# Patient Record
Sex: Female | Born: 2000 | Race: Black or African American | Hispanic: No | Marital: Single | Smoking: Never smoker
Health system: Southern US, Community
[De-identification: ages and names within clinical notes are randomized; demographics above are authoritative.]

## PROBLEM LIST (undated history)

## (undated) ENCOUNTER — Emergency Department (HOSPITAL_COMMUNITY): Admission: EM | Payer: Medicaid Other

## (undated) DIAGNOSIS — D649 Anemia, unspecified: Secondary | ICD-10-CM

## (undated) DIAGNOSIS — G43909 Migraine, unspecified, not intractable, without status migrainosus: Secondary | ICD-10-CM

## (undated) DIAGNOSIS — F329 Major depressive disorder, single episode, unspecified: Secondary | ICD-10-CM

## (undated) DIAGNOSIS — E669 Obesity, unspecified: Secondary | ICD-10-CM

## (undated) DIAGNOSIS — F32A Depression, unspecified: Secondary | ICD-10-CM

## (undated) DIAGNOSIS — F419 Anxiety disorder, unspecified: Secondary | ICD-10-CM

## (undated) HISTORY — PX: NO PAST SURGERIES: SHX2092

---
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
## 2020-01-15 ENCOUNTER — Emergency Department (HOSPITAL_BASED_OUTPATIENT_CLINIC_OR_DEPARTMENT_OTHER)
Admission: EM | Admit: 2020-01-15 | Discharge: 2020-01-15 | Disposition: A | Discharge status: Home / Self Care | Payer: Medicaid Other | Attending: Emergency Medicine | Admitting: Emergency Medicine

## 2020-01-15 ENCOUNTER — Emergency Department (HOSPITAL_BASED_OUTPATIENT_CLINIC_OR_DEPARTMENT_OTHER): Payer: Medicaid Other

## 2020-01-15 ENCOUNTER — Other Ambulatory Visit: Payer: Self-pay

## 2020-01-15 ENCOUNTER — Encounter (HOSPITAL_BASED_OUTPATIENT_CLINIC_OR_DEPARTMENT_OTHER): Payer: Self-pay | Admitting: Emergency Medicine

## 2020-01-15 DIAGNOSIS — Z20822 Contact with and (suspected) exposure to covid-19: Secondary | ICD-10-CM | POA: Diagnosis not present

## 2020-01-15 DIAGNOSIS — R062 Wheezing: Secondary | ICD-10-CM | POA: Insufficient documentation

## 2020-01-15 DIAGNOSIS — R0602 Shortness of breath: Secondary | ICD-10-CM | POA: Diagnosis not present

## 2020-01-15 DIAGNOSIS — R072 Precordial pain: Secondary | ICD-10-CM | POA: Diagnosis not present

## 2020-01-15 DIAGNOSIS — R079 Chest pain, unspecified: Secondary | ICD-10-CM

## 2020-01-15 DIAGNOSIS — R197 Diarrhea, unspecified: Secondary | ICD-10-CM | POA: Diagnosis not present

## 2020-01-15 LAB — CBC
HCT: 37.1 % (ref 36.0–46.0)
Hemoglobin: 12.6 g/dL (ref 12.0–15.0)
MCH: 30.2 pg (ref 26.0–34.0)
MCHC: 34 g/dL (ref 30.0–36.0)
MCV: 89 fL (ref 80.0–100.0)
Platelets: 302 10*3/uL (ref 150–400)
RBC: 4.17 MIL/uL (ref 3.87–5.11)
RDW: 12.5 % (ref 11.5–15.5)
WBC: 4.1 10*3/uL (ref 4.0–10.5)
nRBC: 0 % (ref 0.0–0.2)

## 2020-01-15 LAB — TROPONIN I (HIGH SENSITIVITY)
Troponin I (High Sensitivity): <2 ng/L (ref <18)
Troponin I (High Sensitivity): <2 ng/L (ref <18)

## 2020-01-15 LAB — BASIC METABOLIC PANEL
Anion gap: 9 (ref 5–15)
BUN: 10 mg/dL (ref 6–20)
CO2: 23 mmol/L (ref 22–32)
Calcium: 9.4 mg/dL (ref 8.9–10.3)
Chloride: 106 mmol/L (ref 98–111)
Creatinine, Ser: 0.65 mg/dL (ref 0.44–1.00)
GFR, Estimated: >60 mL/min (ref >60)
Glucose, Bld: 112 mg/dL — ABNORMAL HIGH (ref 70–99)
Potassium: 4.2 mmol/L (ref 3.5–5.1)
Sodium: 138 mmol/L (ref 135–145)

## 2020-01-15 LAB — D-DIMER, QUANTITATIVE: D-Dimer, Quant: 0.53 ug/mL-FEU — ABNORMAL HIGH (ref 0.00–0.50)

## 2020-01-15 LAB — PREGNANCY, URINE: Preg Test, Ur: NEGATIVE

## 2020-01-15 MED ORDER — ALBUTEROL SULFATE HFA 108 (90 BASE) MCG/ACT IN AERS
2.0000 | INHALATION_SPRAY | RESPIRATORY_TRACT | Status: DC | PRN
  Administered 2020-01-15: 2 via RESPIRATORY_TRACT

## 2020-01-15 MED ORDER — ALBUTEROL SULFATE HFA 108 (90 BASE) MCG/ACT IN AERS
INHALATION_SPRAY | RESPIRATORY_TRACT | Status: AC
  Administered 2020-01-15: 2 via RESPIRATORY_TRACT
  Filled 2020-01-15: qty 6.7

## 2020-01-15 MED ORDER — IOHEXOL 350 MG/ML SOLN
100.0000 mL | Freq: Once | INTRAVENOUS | Status: AC | PRN
  Administered 2020-01-15: 100 mL via INTRAVENOUS

## 2020-01-15 NOTE — ED Triage Notes (Signed)
Pt in from home via EMS with c/o left sided chest pain with movement. Denies N/V. Reports similar symptoms occur with her menstrual cycle and low iron. Pt reports unintentional weight loss in last month. Family history of Pulmonary embolism. 10 of albuterol 0.5 atrovent 125 mg solumedrol by EMS. 20 g IV left hand placed by EMS 150/100, 64 heart rate, 100% with treatment. 324 mg of ASA given PTA. 250 ml of NSS given. CBG 106.  Pt presents with audible wheeze.

## 2020-01-15 NOTE — Discharge Instructions (Addendum)
Your workup was reassuring today. There were no signs of blood clots in your lungs. We have sent out a COVID test - please await your results. If positive you will need to self isolate for 7 days from symptom onset.   Please follow up with your PCP and cardiologist Dr. Bjorn Pippin regarding your ED visit today  Return to the ED for any worsening symptoms

## 2020-01-15 NOTE — ED Notes (Signed)
ED Provider at bedside. 

## 2020-01-15 NOTE — ED Provider Notes (Signed)
Assumed care from [facility], PA-C at shift change pending CTA results. See her note for full HPI.  In short, patient is a 20 year old female with a medical history significant for anxiety, depression, anemia presents to the ED due to substernal chest pain that radiates into the left breast that started this morning around 10 AM.  Chest pain associated with wheeze.  Chest pain worse with deep inspiration.  Patient's mother reports a family history of DVT/PE however unsure of the cause.   Plan from previous provider: follow-up with CTA. If negative, COVID test.  ED Course/Procedures    Results for orders placed or performed during the hospital encounter of 01/15/20 (from the past 24 hour(s))  Basic metabolic panel     Status: Abnormal   Collection Time: 01/15/20 12:45 PM  Result Value Ref Range   Sodium 138 135 - 145 mmol/L   Potassium 4.2 3.5 - 5.1 mmol/L   Chloride 106 98 - 111 mmol/L   CO2 23 22 - 32 mmol/L   Glucose, Bld 112 (H) 70 - 99 mg/dL   BUN 10 6 - 20 mg/dL   Creatinine, Ser 9.32 0.44 - 1.00 mg/dL   Calcium 9.4 8.9 - 35.5 mg/dL   GFR, Estimated >73 >22 mL/min   Anion gap 9 5 - 15  CBC     Status: None   Collection Time: 01/15/20 12:45 PM  Result Value Ref Range   WBC 4.1 4.0 - 10.5 K/uL   RBC 4.17 3.87 - 5.11 MIL/uL   Hemoglobin 12.6 12.0 - 15.0 g/dL   HCT 02.5 42.7 - 06.2 %   MCV 89.0 80.0 - 100.0 fL   MCH 30.2 26.0 - 34.0 pg   MCHC 34.0 30.0 - 36.0 g/dL   RDW 37.6 28.3 - 15.1 %   Platelets 302 150 - 400 K/uL   nRBC 0.0 0.0 - 0.2 %  Troponin I (High Sensitivity)     Status: None   Collection Time: 01/15/20 12:45 PM  Result Value Ref Range   Troponin I (High Sensitivity) <2 <18 ng/L  Troponin I (High Sensitivity)     Status: None   Collection Time: 01/15/20  5:51 PM  Result Value Ref Range   Troponin I (High Sensitivity) <2 <18 ng/L  D-dimer, quantitative (not at [facility])     Status: Abnormal   Collection Time: 01/15/20  5:51 PM  Result Value Ref Range    D-Dimer, Quant 0.53 (H) 0.00 - 0.50 ug/mL-FEU  Pregnancy, urine     Status: None   Collection Time: 01/15/20  8:29 PM  Result Value Ref Range   Preg Test, Ur NEGATIVE NEGATIVE    Procedures  MDM  Care assumed from [facility], PA-C at shift change pending CTA results. See her note for full MDM.  20 year old female presents to the ED due to substernal/left-sided pleuritic chest pain that started this morning.  Unfortunately, patient waited numerous hours before initial evaluation.  While in the waiting room mother relate concerned about a possible PE given patient's family history.  Upon arrival, stable vitals.  Patient is afebrile, not tachycardic or hypoxic.  Patient in no acute distress and non-ill-appearing.  I have personally reviewed all lab work ordered by previous provider.  CBC unremarkable no leukocytosis and normal hemoglobin.  BMP reassuring with normal renal function no major electrolyte derangements.  Mild hyperglycemia at 112.  No anion gap.  Doubt DKA.  Negative pregnancy test.  Flat troponins.  Low suspicion for ACS.  D-dimer  elevated at 0.53.  CT a personally reviewed which is negative for signs of PE or other acute abnormalities.  Chest x-ray personally reviewed which is negative for signs of pneumonia, pneumothorax, or widened mediastinum.  EKG personally reviewed which demonstrates normal sinus rhythm no signs of acute ischemia.  Patient given albuterol during her ED stay and notes resolution in symptoms.  9:27 PM reassessed patient at bedside who notes symptom improvement.  Lungs clear to auscultation bilaterally with no wheeze.  Patient speaking in full sentences.  Patient states she is feeling better and is ready to go home.  Covid test pending.  Quarantine guidelines discussed with patient Strict ED precautions discussed with patient. Patient states understanding and agrees to plan. Patient discharged home in no acute distress and stable vitals        Jesusita Oka 01/15/20 [id number]    Charlynne Pander, MD 01/15/20 [phone]

## 2020-01-15 NOTE — ED Notes (Signed)
Pt ambulated through hall. SpO2 maintained at 97 or above. HR no higher than 110. Assisted back into bed. Reports that her chest pain has subsided but still feels wheezy. Rebecca [name], [name] informed. RT Brett Canales requested at bedside to evaluate patient.

## 2020-01-15 NOTE — ED Provider Notes (Signed)
[facility] EMERGENCY DEPARTMENT Provider Note   CSN: [id number] Arrival date & time: 01/15/20  1217     History Chief Complaint  Patient presents with  . Chest Pain    Rebecca [name] is a 20 y.o. female with PMhx anxiety, depression, and anemia who presents to the ED today via EMS with complaint of substernal chest pain radiating into left breast that began when pt woke up around 10 AM this morning. Pt reports she then started noticing that she was wheezing which she has never done before. She called her mom because she knew "something wasn't right" and then proceeded to call EMS. Pt received 10 albuterol, 0.5 atrovent, 125 solumedrol, and 324 mg aspirin en route with some improvement in her symptoms. She states that after the medication her chest pain was no longer constant however is now present with deep inspiration. Mother reports fhx of DVTs and PE - unsure of the cause however states multiple family members have had them and a cousin recently passed away from PE. Pt vapes daily however does not smoke cigarettes. Not on OCPs. No recent prolonged travel or immobilization. No hemoptysis. No active malignancy. No personal history of DVT/PE.   Pt reports that she sometimes will get chest pains when her hgb is low. She receives iron infusions - last received in May. She states she is currently on her  Menstrual period. She does mention that she typically has body aches and diarrhea during her period so cannot say if these symptoms are related to the chest pain and wheezing. She denies any recent sick contacts however is unvaccinated and would like to be tested today.   The history is provided by the patient and medical records.       Past Medical History:  Diagnosis Date  . Anemia   . Anxiety   . Depression   . Migraine   . Obesity     There are no problems to display for this patient.   History reviewed. No pertinent surgical history.   OB History   No obstetric history on  file.     Family History  Problem Relation Age of Onset  . Anemia Mother   . Depression Mother   . Insomnia Mother   . Asthma Father   . ADD / ADHD Father   . Bipolar disorder Father   . Bipolar disorder Maternal Grandmother   . Asthma Maternal Grandmother   . Diabetes Paternal Grandmother     Social History   Tobacco Use  . Smoking status: Never Smoker  . Smokeless tobacco: Never Used  Vaping Use  . Vaping Use: Every day  Substance Use Topics  . Alcohol use: No  . Drug use: Yes    Types: Marijuana    Home Medications Prior to Admission medications   Medication Sig Start Date End Date Taking? Authorizing Provider  fluticasone (FLONASE) 50 MCG/ACT nasal spray Place 1 spray into both nostrils as needed for allergies. 08/16/18   [provider]  hydrOXYzine (ATARAX/VISTARIL) 25 MG tablet Take 1 tablet (25 mg total) by mouth every 6 (six) hours. 08/19/18   Henderly, Britni A, PA-C  naproxen (NAPROSYN) 500 MG tablet Take 500 mg by mouth every 12 (twelve) hours as needed for cramping. 08/16/18   [provider]  ondansetron (ZOFRAN ODT) 4 MG disintegrating tablet Take 1 tablet (4 mg total) by mouth every 6 (six) hours as needed for nausea or vomiting. 05/31/16   Lowanda Foster, NP  Allergies    Patient has no known allergies.  Review of Systems   Review of Systems  Constitutional: Negative for chills and fever.  Respiratory: Positive for shortness of breath and wheezing. Negative for cough.   Cardiovascular: Positive for chest pain.  Gastrointestinal: Positive for diarrhea.  Musculoskeletal: Positive for myalgias.  All other systems reviewed and are negative.   Physical Exam Updated Vital Signs BP 124/76 (BP Location: Left Arm)   Pulse 68   Temp 98.4 F (36.9 C) (Oral)   Resp 20   Ht 5\' 7"  (1.702 m)   Wt 99.8 kg   LMP 01/14/2020   SpO2 100%   BMI 34.46 kg/m   Physical Exam Vitals and nursing note reviewed.  Constitutional:      Appearance:  She is not ill-appearing.     Comments: Appears anxious  HENT:     Head: Normocephalic and atraumatic.  Eyes:     Conjunctiva/sclera: Conjunctivae normal.  Cardiovascular:     Rate and Rhythm: Normal rate and regular rhythm.     Pulses:          Radial pulses are 2+ on the right side and 2+ on the left side.       Dorsalis pedis pulses are 2+ on the right side and 2+ on the left side.     Heart sounds: Normal heart sounds.  Pulmonary:     Breath sounds: Normal breath sounds. No decreased breath sounds, wheezing, rhonchi or rales.     Comments: Pt able to speak in full sentences however every 5-6 words she will take a deep breath with audible upper airway wheezes. Lungs are clear to auscultation without any lower airway wheeze, rales, rhonchi. Satting 100% on RA.  Abdominal:     Palpations: Abdomen is soft.     Tenderness: There is no abdominal tenderness. There is no guarding or rebound.  Musculoskeletal:     Cervical back: Neck supple.     Right lower leg: No tenderness. No edema.     Left lower leg: No tenderness. No edema.  Skin:    General: Skin is warm and dry.  Neurological:     Mental Status: She is alert.     ED Results / Procedures / Treatments   Labs (all labs ordered are listed, but only abnormal results are displayed) Labs Reviewed  BASIC METABOLIC PANEL - Abnormal; Notable for the following components:      Result Value   Glucose, Bld 112 (*)    All other components within normal limits  D-DIMER, QUANTITATIVE (NOT AT [facility] - Abnormal; Notable for the following components:   D-Dimer, Quant 0.53 (*)    All other components within normal limits  CBC  PREGNANCY, URINE  TROPONIN I (HIGH SENSITIVITY)  TROPONIN I (HIGH SENSITIVITY)    EKG None  Radiology DG Chest 2 View  Result Date: 01/15/2020 CLINICAL DATA:  Chest pain EXAM: CHEST - 2 VIEW COMPARISON:  None. FINDINGS: Heart and mediastinal contours are within normal limits. No focal opacities or effusions.  No acute bony abnormality. IMPRESSION: No active cardiopulmonary disease. Electronically Signed   By: 03/14/2020 M.D.   On: 01/15/2020 13:10    Procedures Procedures (including critical care time)  Medications Ordered in ED Medications  albuterol (VENTOLIN HFA) 108 (90 Base) MCG/ACT inhaler 2 puff (2 puffs Inhalation Given 01/15/20 1748)  iohexol (OMNIPAQUE) 350 MG/ML injection 100 mL (100 mLs Intravenous Contrast Given 01/15/20 1932)    ED Course  I  have reviewed the triage vital signs and the nursing notes.  Pertinent labs & imaging results that were available during my care of the patient were reviewed by me and considered in my medical decision making (see chart for details).    MDM Rules/Calculators/A&P                          20 year old female who presents to the ED today with complaint of substernal/left-sided pleuritic chest pain that began this morning with associated wheezing.  She called 911 who provided her with multiple medications including albuterol, Atrovent, Solu-Medrol, 324 mg aspirin.  Patient sat in the waiting room for several hours where she had an EKG, chest x-ray, lab work occluding a troponin.  EKG sinus rhythm with sinus arrhythmia.  Chest x-ray clear.  Troponin less than 2.  CBC also obtained with a hemoglobin stable at 12.6, patient reports history of anemia and states that she will have chest pain from time to time when she is anemic.  She receives iron infusions, last received in May of this year.  BMP without electrolyte abnormalities.  While patient was in the waiting room mother relayed to nursing staff that there is a positive family history of DVT and PE, unprovoked.  It was decided that a D-dimer would be ordered while she is in the waiting room which has returned positive at 0.53.  Patient without any risk factors for PE besides this family history with unknown etiology.  We will need to obtain CTA at this time.  On exam patient appears anxious.  She is able  speak in full sentences without difficulty however every 5-6 word she will take a deep breath and have upper airway wheezing, no stridor.  She has no lower airway wheezing, rales, rhonchi.  Suspect some anxiety is causing this.  Patient with a history of asthma, has received multiple rounds of albuterol while in the waiting room.  She does endorse that she is having some diarrhea and body aches as well however states that this is typical when she was on her menses which she currently is.  She is unvaccinated against COVID-19, will plan to swab.  If CTA negative for PE and patient does not require admission we will do send out Covid test.  If CTA is positive then will obtain 2-hour Covid test.  If lab work and remainder of work-up is reassuring patient to be discharged home with close PCP follow-up.  It does appear urine pregnancy test was ordered while in the waiting room which was never obtained, she did receive a chest x-ray prior to this being obtained.  We will plan to obtain this prior to CTA.   Pt informed she needs to urinate for preg test. She is on her menstrual cycle and states she is not pregnant. States she cannot urinate at this time; radiology aware and states they do not need it for CT  At shift change case signed out to Charmaine Downs, [place], who will dispo patient accordingly. If CTA negative, she will need 6-24 COVID test. If CTA positive, she will need 2 hour COVID test.   This note was prepared using Dragon voice recognition software and may include unintentional dictation errors due to the inherent limitations of voice recognition software.  Rebecca [name] was evaluated in Emergency Department on 01/15/2020 for the symptoms described in the history of present illness. She was evaluated in the context of the global COVID-19 pandemic, which necessitated  consideration that the patient might be at risk for infection with the SARS-CoV-2 virus that causes COVID-19. Institutional protocols and  algorithms that pertain to the evaluation of patients at risk for COVID-19 are in a state of rapid change based on information released by regulatory bodies including the CDC and federal and state organizations. These policies and algorithms were followed during the patient's care in the ED.  Final Clinical Impression(s) / ED Diagnoses Final diagnoses:  Nonspecific chest pain  Person under investigation for COVID-19    Rx / DC Orders ED Discharge Orders    None       Tanda Rockers, PA-C 01/15/20 Virl Son, MD 01/15/20 2312

## 2020-01-15 NOTE — ED Notes (Signed)
Unable to provide urine. Ambulated to BR. Mom at bedside, eating kids meal from [employer]

## 2020-01-15 NOTE — ED Notes (Signed)
Called to check patient while in lobby, states her chest hurts. Pt tearful, hyperventilating

## 2020-01-15 NOTE — ED Notes (Signed)
Pt mother upset about wait time. I explained the process to her and informed her of all the care that we have provided thus far. Additional orders obtained from Dr. Silverio Lay.

## 2020-01-15 NOTE — ED Notes (Addendum)
Pt ambulatory to bathroom

## 2020-01-15 NOTE — ED Notes (Signed)
Discharge instructions discussed with patient. Verbalized understanding. All questions answered. Departs ED at this time in stable condition.

## 2020-01-16 LAB — SARS CORONAVIRUS 2 (TAT 6-24 HRS): SARS Coronavirus 2: NEGATIVE

## 2020-04-07 ENCOUNTER — Emergency Department (HOSPITAL_BASED_OUTPATIENT_CLINIC_OR_DEPARTMENT_OTHER)
Admission: EM | Admit: 2020-04-07 | Discharge: 2020-04-07 | Disposition: A | Discharge status: Home / Self Care | Payer: Medicaid Other | Attending: Emergency Medicine | Admitting: Emergency Medicine

## 2020-04-07 ENCOUNTER — Other Ambulatory Visit: Payer: Self-pay

## 2020-04-07 ENCOUNTER — Encounter (HOSPITAL_BASED_OUTPATIENT_CLINIC_OR_DEPARTMENT_OTHER): Payer: Self-pay | Admitting: Emergency Medicine

## 2020-04-07 ENCOUNTER — Emergency Department (HOSPITAL_BASED_OUTPATIENT_CLINIC_OR_DEPARTMENT_OTHER): Payer: Medicaid Other

## 2020-04-07 DIAGNOSIS — R0789 Other chest pain: Secondary | ICD-10-CM | POA: Diagnosis not present

## 2020-04-07 DIAGNOSIS — R079 Chest pain, unspecified: Secondary | ICD-10-CM | POA: Diagnosis present

## 2020-04-07 DIAGNOSIS — M791 Myalgia, unspecified site: Secondary | ICD-10-CM | POA: Insufficient documentation

## 2020-04-07 DIAGNOSIS — R52 Pain, unspecified: Secondary | ICD-10-CM

## 2020-04-07 LAB — BASIC METABOLIC PANEL
Anion gap: 7 (ref 5–15)
BUN: 8 mg/dL (ref 6–20)
CO2: 25 mmol/L (ref 22–32)
Calcium: 8.6 mg/dL — ABNORMAL LOW (ref 8.9–10.3)
Chloride: 105 mmol/L (ref 98–111)
Creatinine, Ser: 0.56 mg/dL (ref 0.44–1.00)
GFR, Estimated: >60 mL/min (ref >60)
Glucose, Bld: 97 mg/dL (ref 70–99)
Potassium: 3.8 mmol/L (ref 3.5–5.1)
Sodium: 137 mmol/L (ref 135–145)

## 2020-04-07 LAB — CBC WITH DIFFERENTIAL/PLATELET
Abs Immature Granulocytes: 0.02 10*3/uL (ref 0.00–0.07)
Basophils Absolute: 0 10*3/uL (ref 0.0–0.1)
Basophils Relative: 1 %
Eosinophils Absolute: 0 10*3/uL (ref 0.0–0.5)
Eosinophils Relative: 1 %
HCT: 32.5 % — ABNORMAL LOW (ref 36.0–46.0)
Hemoglobin: 10.8 g/dL — ABNORMAL LOW (ref 12.0–15.0)
Immature Granulocytes: 0 %
Lymphocytes Relative: 50 %
Lymphs Abs: 2.3 10*3/uL (ref 0.7–4.0)
MCH: 30 pg (ref 26.0–34.0)
MCHC: 33.2 g/dL (ref 30.0–36.0)
MCV: 90.3 fL (ref 80.0–100.0)
Monocytes Absolute: 0.3 10*3/uL (ref 0.1–1.0)
Monocytes Relative: 7 %
Neutro Abs: 1.9 10*3/uL (ref 1.7–7.7)
Neutrophils Relative %: 41 %
Platelets: 294 10*3/uL (ref 150–400)
RBC: 3.6 MIL/uL — ABNORMAL LOW (ref 3.87–5.11)
RDW: 12.7 % (ref 11.5–15.5)
Smear Review: NORMAL
WBC: 4.6 10*3/uL (ref 4.0–10.5)
nRBC: 0 % (ref 0.0–0.2)

## 2020-04-07 LAB — HEPATIC FUNCTION PANEL
ALT: 11 U/L (ref 0–44)
AST: 17 U/L (ref 15–41)
Albumin: 3.5 g/dL (ref 3.5–5.0)
Alkaline Phosphatase: 55 U/L (ref 38–126)
Bilirubin, Direct: 0.1 mg/dL (ref 0.0–0.2)
Indirect Bilirubin: 0.3 mg/dL (ref 0.3–0.9)
Total Bilirubin: 0.4 mg/dL (ref 0.3–1.2)
Total Protein: 7.2 g/dL (ref 6.5–8.1)

## 2020-04-07 LAB — URINALYSIS, MICROSCOPIC (REFLEX): WBC, UA: NONE SEEN WBC/hpf (ref 0–5)

## 2020-04-07 LAB — URINALYSIS, ROUTINE W REFLEX MICROSCOPIC
Bilirubin Urine: NEGATIVE
Glucose, UA: NEGATIVE mg/dL
Ketones, ur: NEGATIVE mg/dL
Leukocytes,Ua: NEGATIVE
Nitrite: NEGATIVE
Protein, ur: NEGATIVE mg/dL
Specific Gravity, Urine: 1.015 (ref 1.005–1.030)
pH: 7 (ref 5.0–8.0)

## 2020-04-07 LAB — PREGNANCY, URINE: Preg Test, Ur: NEGATIVE

## 2020-04-07 LAB — TROPONIN I (HIGH SENSITIVITY): Troponin I (High Sensitivity): <2 ng/L (ref <18)

## 2020-04-07 LAB — LIPASE, BLOOD: Lipase: 26 U/L (ref 11–51)

## 2020-04-07 MED ORDER — IBUPROFEN 100 MG/5ML PO SUSP
400.0000 mg | Freq: Once | ORAL | Status: AC
  Administered 2020-04-07: 400 mg via ORAL
  Filled 2020-04-07: qty 20

## 2020-04-07 MED ORDER — HYDROCODONE-ACETAMINOPHEN 7.5-325 MG/15ML PO SOLN: 10.0000 mL | Freq: Once | ORAL | Status: DC

## 2020-04-07 NOTE — ED Provider Notes (Signed)
[facility] EMERGENCY DEPARTMENT Provider Note   CSN: [id number] Arrival date & time: 04/07/20  1740     History Chief Complaint  Patient presents with  . Generalized Body Aches    Rebecca [name] is a 20 y.o. female.  Presented to ER with multiple complaints.  Patient reports yesterday she was nauseated and had a few episodes of nonbloody nonbilious emesis.  No more vomiting today, does not feel nauseous at present.  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  States pain worse in her chest.  Seems to have eased off.  No longer having pain.  HPI     Past Medical History:  Diagnosis Date  . Anemia   . Anxiety   . Depression   . Migraine   . Obesity     There are no problems to display for this patient.   History reviewed. No pertinent surgical history.   OB History   No obstetric history on file.     Family History  Problem Relation Age of Onset  . Anemia Mother   . Depression Mother   . Insomnia Mother   . Asthma Father   . ADD / ADHD Father   . Bipolar disorder Father   . Bipolar disorder Maternal Grandmother   . Asthma Maternal Grandmother   . Diabetes Paternal Grandmother     Social History   Tobacco Use  . Smoking status: Never Smoker  . Smokeless tobacco: Never Used  Vaping Use  . Vaping Use: Every day  Substance Use Topics  . Alcohol use: No  . Drug use: Yes    Types: Marijuana    Home Medications Prior to Admission medications   Medication Sig Start Date End Date Taking? Authorizing Provider  fluticasone (FLONASE) 50 MCG/ACT nasal spray Place 1 spray into both nostrils as needed for allergies. 08/16/18   [provider]  hydrOXYzine (ATARAX/VISTARIL) 25 MG tablet Take 1 tablet (25 mg total) by mouth every 6 (six) hours. 08/19/18   Henderly, Britni A, PA-C  naproxen (NAPROSYN) 500 MG tablet Take 500 mg by mouth every 12 (twelve) hours as needed for cramping.  08/16/18   [provider]  ondansetron (ZOFRAN ODT) 4 MG disintegrating tablet Take 1 tablet (4 mg total) by mouth every 6 (six) hours as needed for nausea or vomiting. 05/31/16   Lowanda Foster, NP    Allergies    Patient has no known allergies.  Review of Systems   Review of Systems  Constitutional: Positive for fatigue. Negative for chills and fever.  HENT: Negative for ear pain and sore throat.   Eyes: Negative for pain and visual disturbance.  Respiratory: Negative for cough and shortness of breath.   Cardiovascular: Positive for chest pain. Negative for palpitations.  Gastrointestinal: Positive for abdominal pain. Negative for vomiting.  Genitourinary: Negative for dysuria and hematuria.  Musculoskeletal: Negative for arthralgias and back pain.  Skin: Negative for color change and rash.  Neurological: Negative for seizures and syncope.  All other systems reviewed and are negative.   Physical Exam Updated Vital Signs BP 115/78 (BP Location: Right Arm)   Pulse 68   Temp 98.3 F (36.8 C) (Oral)   Resp 18   Ht 5\' 7"  (1.702 m)   Wt 99.8 kg   LMP 03/21/2020   SpO2 100%   BMI 34.46 kg/m   Physical Exam Vitals and nursing note reviewed.  Constitutional:  General: She is not in acute distress.    Appearance: She is well-developed.  HENT:     Head: Normocephalic and atraumatic.  Eyes:     Conjunctiva/sclera: Conjunctivae normal.  Cardiovascular:     Rate and Rhythm: Normal rate and regular rhythm.     Heart sounds: No murmur heard.   Pulmonary:     Effort: Pulmonary effort is normal. No respiratory distress.     Breath sounds: Normal breath sounds.  Abdominal:     Palpations: Abdomen is soft.     Tenderness: There is no abdominal tenderness.  Musculoskeletal:        General: No deformity or signs of injury.     Cervical back: Neck supple.  Skin:    General: Skin is warm and dry.  Neurological:     Mental Status: She is alert.  Psychiatric:      Comments: Occasional inappropriate laughter     ED Results / Procedures / Treatments   Labs (all labs ordered are listed, but only abnormal results are displayed) Labs Reviewed  URINALYSIS, ROUTINE W REFLEX MICROSCOPIC - Abnormal; Notable for the following components:      Result Value   APPearance CLOUDY (*)    Hgb urine dipstick LARGE (*)    All other components within normal limits  BASIC METABOLIC PANEL - Abnormal; Notable for the following components:   Calcium 8.6 (*)    All other components within normal limits  CBC WITH DIFFERENTIAL/PLATELET - Abnormal; Notable for the following components:   RBC 3.60 (*)    Hemoglobin 10.8 (*)    HCT 32.5 (*)    All other components within normal limits  URINALYSIS, MICROSCOPIC (REFLEX) - Abnormal; Notable for the following components:   Bacteria, UA RARE (*)    All other components within normal limits  PREGNANCY, URINE  HEPATIC FUNCTION PANEL  LIPASE, BLOOD  TROPONIN I (HIGH SENSITIVITY)    EKG EKG Interpretation  Date/Time:  Saturday April 07 2020 18:00:54 EDT Ventricular Rate:  56 PR Interval:    QRS Duration: 96 QT Interval:  396 QTC Calculation: 383 R Axis:   85 Text Interpretation: Sinus rhythm Atrial premature complex Confirmed by Marianna Fuss ([id number]) on 04/07/2020 6:29:17 PM   Radiology DG Chest 2 View  Result Date: 04/07/2020 CLINICAL DATA:  Chest pain EXAM: CHEST - 2 VIEW COMPARISON:  01/15/2020 FINDINGS: The heart size and mediastinal contours are within normal limits. Both lungs are clear. The visualized skeletal structures are unremarkable. IMPRESSION: No active cardiopulmonary disease. Electronically Signed   By: Deatra Robinson M.D.   On: 04/07/2020 18:51    Procedures Procedures   Medications Ordered in ED Medications  ibuprofen (ADVIL) 100 MG/5ML suspension 400 mg (400 mg Oral Given 04/07/20 1829)    ED Course  I have reviewed the triage vital signs and the nursing notes.  Pertinent labs & imaging  results that were available during my care of the patient were reviewed by me and considered in my medical decision making (see chart for details).    MDM Rules/Calculators/A&P                         20 year old presents to ER after she developed sudden generalized body pains, chest pains while at work.  On my exam, she appears well in no distress.  Her vital signs are all stable.  She had some tenderness over her anterior chest wall but otherwise unremarkable.  EKG without acute  ischemic change, troponin of the normal limits, doubt ACS.  The remainder of her labs are all grossly stable.  Recommend she follow-up with her primary doctor.  Discharge.   After the discussed management above, the patient was determined to be safe for discharge.  The patient was in agreement with this plan and all questions regarding their care were answered.  ED return precautions were discussed and the patient will return to the ED with any significant worsening of condition.   Final Clinical Impression(s) / ED Diagnoses Final diagnoses:  Chest pain, unspecified type  Body aches    Rx / DC Orders ED Discharge Orders    None       Milagros Loll, MD 04/07/20 [phone]

## 2020-04-07 NOTE — ED Triage Notes (Addendum)
Brought by ems from a restaurant.  Reports all over body pain.  Hx of the same.  Told ems she had a hx of anemia that requires iron infusions.  Ems also reported she was anxious having a "panick attack" on their arrival.  Tearful in triage.  Later in triage c/o chest pain as well.  Reports she had n/v all day yesterday then felt weird when she got to work.

## 2020-04-07 NOTE — Discharge Instructions (Addendum)
Please follow-up with your primary doctor.  Return to ER if you develop worsening chest pain or other new concerning symptom.

## 2020-05-02 ENCOUNTER — Other Ambulatory Visit: Payer: Self-pay

## 2020-05-02 ENCOUNTER — Emergency Department (HOSPITAL_BASED_OUTPATIENT_CLINIC_OR_DEPARTMENT_OTHER): Payer: Medicaid Other

## 2020-05-02 ENCOUNTER — Emergency Department (HOSPITAL_BASED_OUTPATIENT_CLINIC_OR_DEPARTMENT_OTHER)
Admission: EM | Admit: 2020-05-02 | Discharge: 2020-05-02 | Disposition: A | Discharge status: Home / Self Care | Payer: Medicaid Other | Attending: Emergency Medicine | Admitting: Emergency Medicine

## 2020-05-02 ENCOUNTER — Encounter (HOSPITAL_BASED_OUTPATIENT_CLINIC_OR_DEPARTMENT_OTHER): Payer: Self-pay

## 2020-05-02 DIAGNOSIS — S8991XA Unspecified injury of right lower leg, initial encounter: Secondary | ICD-10-CM | POA: Diagnosis present

## 2020-05-02 DIAGNOSIS — S8391XA Sprain of unspecified site of right knee, initial encounter: Secondary | ICD-10-CM | POA: Diagnosis not present

## 2020-05-02 DIAGNOSIS — Y9302 Activity, running: Secondary | ICD-10-CM | POA: Insufficient documentation

## 2020-05-02 DIAGNOSIS — W108XXA Fall (on) (from) other stairs and steps, initial encounter: Secondary | ICD-10-CM | POA: Insufficient documentation

## 2020-05-02 DIAGNOSIS — S86911A Strain of unspecified muscle(s) and tendon(s) at lower leg level, right leg, initial encounter: Secondary | ICD-10-CM

## 2020-05-02 DIAGNOSIS — S8392XA Sprain of unspecified site of left knee, initial encounter: Secondary | ICD-10-CM | POA: Insufficient documentation

## 2020-05-02 DIAGNOSIS — S86912A Strain of unspecified muscle(s) and tendon(s) at lower leg level, left leg, initial encounter: Secondary | ICD-10-CM

## 2020-05-02 NOTE — ED Triage Notes (Signed)
Pt sates she was running down steps yesterday/fell-pain to bilat knees with right worse that left-to triage in w/c-NAD

## 2020-05-02 NOTE — Discharge Instructions (Addendum)
Follow up with your primary doctor. Return to ER as needed. Take tylenol or motrin as needed for pain.

## 2020-05-02 NOTE — ED Provider Notes (Signed)
[facility] EMERGENCY DEPARTMENT Provider Note   CSN: [id number] Arrival date & time: 05/02/20  1143     History Chief Complaint  Patient presents with  . Fall    Rebecca [name] is a 20 y.o. female.  Presents to ER with concern for fall.  Patient reports that she fell from standing landing on both of her knees.  Is having pain primarily in her right knee.  Is able to walk.  No numbness or weakness.  Denies any other injuries.  HPI     Past Medical History:  Diagnosis Date  . Anemia   . Anxiety   . Depression   . Migraine   . Obesity     There are no problems to display for this patient.   History reviewed. No pertinent surgical history.   OB History   No obstetric history on file.     Family History  Problem Relation Age of Onset  . Anemia Mother   . Depression Mother   . Insomnia Mother   . Asthma Father   . ADD / ADHD Father   . Bipolar disorder Father   . Bipolar disorder Maternal Grandmother   . Asthma Maternal Grandmother   . Diabetes Paternal Grandmother     Social History   Tobacco Use  . Smoking status: Never Smoker  . Smokeless tobacco: Never Used  Vaping Use  . Vaping Use: Former  Substance Use Topics  . Alcohol use: No  . Drug use: Yes    Types: Marijuana    Home Medications Prior to Admission medications   Medication Sig Start Date End Date Taking? Authorizing Provider  fluticasone (FLONASE) 50 MCG/ACT nasal spray Place 1 spray into both nostrils as needed for allergies. 08/16/18   [provider]  hydrOXYzine (ATARAX/VISTARIL) 25 MG tablet Take 1 tablet (25 mg total) by mouth every 6 (six) hours. 08/19/18   Henderly, Britni A, PA-C  naproxen (NAPROSYN) 500 MG tablet Take 500 mg by mouth every 12 (twelve) hours as needed for cramping. 08/16/18   [provider]  ondansetron (ZOFRAN ODT) 4 MG disintegrating tablet Take 1 tablet (4 mg total) by mouth every 6 (six) hours as needed for nausea or vomiting. 05/31/16   Lowanda Foster, NP    Allergies    Patient has no known allergies.  Review of Systems   Review of Systems  Musculoskeletal: Positive for arthralgias.  All other systems reviewed and are negative.   Physical Exam Updated Vital Signs BP 106/70 (BP Location: Left Arm)   Pulse 85   Temp 97.8 F (36.6 C) (Oral)   Resp 18   Ht 5\' 7"  (1.702 m)   Wt 97.5 kg   LMP 04/05/2020   SpO2 100%   BMI 33.67 kg/m   Physical Exam Vitals and nursing note reviewed.  Constitutional:      General: She is not in acute distress.    Appearance: She is well-developed.  HENT:     Head: Normocephalic and atraumatic.  Eyes:     Conjunctiva/sclera: Conjunctivae normal.  Cardiovascular:     Rate and Rhythm: Normal rate.     Pulses: Normal pulses.  Pulmonary:     Effort: Pulmonary effort is normal. No respiratory distress.  Abdominal:     Palpations: Abdomen is soft.     Tenderness: There is no abdominal tenderness.  Musculoskeletal:     Cervical back: Neck supple.     Comments: R knee has mild swelling and small  echymosis over anterior knee, some ttp; normal joint ROM, normal dp/pt pulses  L knee has normal ROM no echymosis, no focal TTP, normal dp/pt pulses  Skin:    General: Skin is warm and dry.  Neurological:     Mental Status: She is alert.     ED Results / Procedures / Treatments   Labs (all labs ordered are listed, but only abnormal results are displayed) Labs Reviewed - No data to display  EKG None  Radiology DG Knee Complete 4 Views Right  Result Date: 05/02/2020 CLINICAL DATA:  Right knee pain after fall. EXAM: RIGHT KNEE - COMPLETE 4+ VIEW COMPARISON:  None. FINDINGS: No evidence of fracture, dislocation, or joint effusion. No evidence of arthropathy or other focal bone abnormality. Soft tissues are unremarkable. IMPRESSION: Negative. Electronically Signed   By: Lupita Raider M.D.   On: 05/02/2020 12:53    Procedures Procedures   Medications Ordered in ED Medications - No  data to display  ED Course  I have reviewed the triage vital signs and the nursing notes.  Pertinent labs & imaging results that were available during my care of the patient were reviewed by me and considered in my medical decision making (see chart for details).    MDM Rules/Calculators/A&P                         20 year old girl presents to ER after mechanical fall, complaining primarily of right knee pain.  X-ray negative.  She is ambulatory without difficulty.  Discharged home.  Suspect MSK strain.  After the discussed management above, the patient was determined to be safe for discharge.  The patient was in agreement with this plan and all questions regarding their care were answered.  ED return precautions were discussed and the patient will return to the ED with any significant worsening of condition.    Final Clinical Impression(s) / ED Diagnoses Final diagnoses:  Strain of both knees, initial encounter    Rx / DC Orders ED Discharge Orders    None       Milagros Loll, MD 05/02/20 1520

## 2020-05-13 ENCOUNTER — Other Ambulatory Visit: Payer: Self-pay

## 2020-05-13 ENCOUNTER — Encounter (HOSPITAL_BASED_OUTPATIENT_CLINIC_OR_DEPARTMENT_OTHER): Payer: Self-pay | Admitting: Emergency Medicine

## 2020-05-13 DIAGNOSIS — S161XXA Strain of muscle, fascia and tendon at neck level, initial encounter: Secondary | ICD-10-CM | POA: Insufficient documentation

## 2020-05-13 DIAGNOSIS — Y92410 Unspecified street and highway as the place of occurrence of the external cause: Secondary | ICD-10-CM | POA: Diagnosis not present

## 2020-05-13 DIAGNOSIS — S199XXA Unspecified injury of neck, initial encounter: Secondary | ICD-10-CM | POA: Diagnosis present
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
## 2020-05-14 ENCOUNTER — Emergency Department (HOSPITAL_BASED_OUTPATIENT_CLINIC_OR_DEPARTMENT_OTHER): Payer: Medicaid Other

## 2020-05-14 ENCOUNTER — Emergency Department (HOSPITAL_BASED_OUTPATIENT_CLINIC_OR_DEPARTMENT_OTHER)
Admission: EM | Admit: 2020-05-14 | Discharge: 2020-05-14 | Disposition: A | Discharge status: Home / Self Care | Payer: Medicaid Other | Attending: Emergency Medicine | Admitting: Emergency Medicine

## 2020-05-14 DIAGNOSIS — S161XXA Strain of muscle, fascia and tendon at neck level, initial encounter: Secondary | ICD-10-CM

## 2020-05-14 MED ORDER — IOHEXOL 350 MG/ML SOLN: 100.0000 mL | Freq: Once | INTRAVENOUS | Status: DC

## 2020-05-14 MED ORDER — METOCLOPRAMIDE HCL 5 MG/ML IJ SOLN
10.0000 mg | Freq: Once | INTRAMUSCULAR | Status: AC
  Administered 2020-05-14: 10 mg via INTRAVENOUS
  Filled 2020-05-14: qty 2

## 2020-05-14 NOTE — ED Notes (Signed)
Pt states she is having a panic attack and don't want to be here any more. Refusing CT scan. MD made aware.
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
## 2020-09-26 ENCOUNTER — Emergency Department (HOSPITAL_BASED_OUTPATIENT_CLINIC_OR_DEPARTMENT_OTHER)
Admission: EM | Admit: 2020-09-26 | Discharge: 2020-09-26 | Disposition: A | Discharge status: Home / Self Care | Payer: Medicaid Other | Attending: Emergency Medicine | Admitting: Emergency Medicine

## 2020-09-26 ENCOUNTER — Other Ambulatory Visit: Payer: Self-pay

## 2020-09-26 ENCOUNTER — Emergency Department (HOSPITAL_BASED_OUTPATIENT_CLINIC_OR_DEPARTMENT_OTHER): Payer: Medicaid Other

## 2020-09-26 ENCOUNTER — Encounter (HOSPITAL_BASED_OUTPATIENT_CLINIC_OR_DEPARTMENT_OTHER): Payer: Self-pay | Admitting: Emergency Medicine

## 2020-09-26 DIAGNOSIS — M545 Low back pain, unspecified: Secondary | ICD-10-CM | POA: Diagnosis not present

## 2020-09-26 DIAGNOSIS — M546 Pain in thoracic spine: Secondary | ICD-10-CM | POA: Insufficient documentation

## 2020-09-26 DIAGNOSIS — R300 Dysuria: Secondary | ICD-10-CM | POA: Insufficient documentation

## 2020-09-26 DIAGNOSIS — R319 Hematuria, unspecified: Secondary | ICD-10-CM | POA: Diagnosis not present

## 2020-09-26 LAB — URINALYSIS, ROUTINE W REFLEX MICROSCOPIC
Bilirubin Urine: NEGATIVE
Glucose, UA: NEGATIVE mg/dL
Ketones, ur: NEGATIVE mg/dL
Nitrite: NEGATIVE
Protein, ur: NEGATIVE mg/dL
Specific Gravity, Urine: 1.015 (ref 1.005–1.030)
pH: 7.5 (ref 5.0–8.0)

## 2020-09-26 LAB — URINALYSIS, MICROSCOPIC (REFLEX): WBC, UA: >50 WBC/hpf (ref 0–5)

## 2020-09-26 LAB — PREGNANCY, URINE: Preg Test, Ur: NEGATIVE

## 2020-09-26 MED ORDER — NAPROXEN 375 MG PO TABS: 375.0000 mg | ORAL_TABLET | Freq: Two times a day (BID) | ORAL | 0 refills | Status: DC

## 2020-09-26 MED ORDER — CYCLOBENZAPRINE HCL 10 MG PO TABS: 10.0000 mg | ORAL_TABLET | Freq: Two times a day (BID) | ORAL | 0 refills | Status: DC | PRN

## 2020-09-26 MED ORDER — CEPHALEXIN 250 MG PO CAPS: 250.0000 mg | ORAL_CAPSULE | Freq: Four times a day (QID) | ORAL | 0 refills | Status: DC

## 2020-09-26 NOTE — ED Triage Notes (Signed)
Reports pain in center of the back that started last night during intercourse.  Has not taken anything for pain.  Ambulatory in triage.

## 2020-09-26 NOTE — Discharge Instructions (Signed)
Please refer to the attached instructions. Take medication as directed. 

## 2020-09-26 NOTE — ED Notes (Signed)
Patient reports low back pain all across lumbar region from right to left flank

## 2020-09-26 NOTE — ED Provider Notes (Signed)
[facility] EMERGENCY DEPARTMENT Provider Note   CSN: [id number] Arrival date & time: 09/26/20  9242     History No chief complaint on file.   Rebecca [name] is a 20 y.o. female.  Patient reports developing low thoracic and upper lumbar back discomfort, onset after intercourse last evening. Patient has attempted rest, soaking in hot tub without relief. She has not attempted any medications for pain control. Sexually active, no condom use. Mild dysuria for several days. Denies vaginal discharge or pain.  The history is provided by the patient. No language interpreter was used.  Back Pain Location:  Thoracic spine and lumbar spine Quality:  Aching and stiffness Radiates to:  Does not radiate Pain severity:  Moderate Pain is:  Same all the time Onset quality:  Sudden Duration:  1 day Timing:  Sporadic Progression:  Waxing and waning Chronicity:  New Ineffective treatments:  Bed rest and heating pad Associated symptoms: dysuria   Associated symptoms: no abdominal pain, no bladder incontinence, no bowel incontinence and no pelvic pain       Past Medical History:  Diagnosis Date   Anemia    Anxiety    Depression    Migraine    Obesity     There are no problems to display for this patient.   History reviewed. No pertinent surgical history.   OB History   No obstetric history on file.     Family History  Problem Relation Age of Onset   Anemia Mother    Depression Mother    Insomnia Mother    Asthma Father    ADD / ADHD Father    Bipolar disorder Father    Bipolar disorder Maternal Grandmother    Asthma Maternal Grandmother    Diabetes Paternal Grandmother     Social History   Tobacco Use   Smoking status: Never   Smokeless tobacco: Never  Vaping Use   Vaping Use: Former  Substance Use Topics   Alcohol use: No   Drug use: Yes    Types: Marijuana    Home Medications Prior to Admission medications   Medication Sig Start Date End Date Taking?  Authorizing Provider  fluticasone (FLONASE) 50 MCG/ACT nasal spray Place 1 spray into both nostrils as needed for allergies. 08/16/18   [provider]  hydrOXYzine (ATARAX/VISTARIL) 25 MG tablet Take 1 tablet (25 mg total) by mouth every 6 (six) hours. 08/19/18   Henderly, Britni A, PA-C  naproxen (NAPROSYN) 500 MG tablet Take 500 mg by mouth every 12 (twelve) hours as needed for cramping. 08/16/18   [provider]  ondansetron (ZOFRAN ODT) 4 MG disintegrating tablet Take 1 tablet (4 mg total) by mouth every 6 (six) hours as needed for nausea or vomiting. 05/31/16   Lowanda Foster, NP    Allergies    Patient has no known allergies.  Review of Systems   Review of Systems  Gastrointestinal:  Negative for abdominal pain and bowel incontinence.  Genitourinary:  Positive for dysuria. Negative for bladder incontinence, pelvic pain, vaginal bleeding, vaginal discharge and vaginal pain.  Musculoskeletal:  Positive for back pain.  All other systems reviewed and are negative.  Physical Exam Updated Vital Signs BP 119/86 (BP Location: Right Arm)   Pulse 79   Temp 98 F (36.7 C) (Oral)   Resp 18   Ht 5\' 7"  (1.702 m)   Wt 92.1 kg   LMP 09/15/2020   SpO2 100%   BMI 31.79 kg/m   Physical Exam  Vitals and nursing note reviewed.  Constitutional:      Appearance: Normal appearance.  HENT:     Head: Normocephalic.     Nose: Nose normal.     Mouth/Throat:     Mouth: Mucous membranes are moist.  Eyes:     Conjunctiva/sclera: Conjunctivae normal.  Cardiovascular:     Rate and Rhythm: Normal rate.  Pulmonary:     Effort: Pulmonary effort is normal.  Abdominal:     Palpations: Abdomen is soft.  Musculoskeletal:        General: Tenderness present. Normal range of motion.     Thoracic back: Spasms and tenderness present.     Lumbar back: Spasms and tenderness present.       Back:  Skin:    General: Skin is warm and dry.  Neurological:     General: No focal deficit present.      Mental Status: She is alert and oriented to person, place, and time.  Psychiatric:        Mood and Affect: Mood normal.        Behavior: Behavior normal.    ED Results / Procedures / Treatments   Labs (all labs ordered are listed, but only abnormal results are displayed) Labs Reviewed  URINALYSIS, ROUTINE W REFLEX MICROSCOPIC - Abnormal; Notable for the following components:      Result Value   APPearance CLOUDY (*)    Hgb urine dipstick MODERATE (*)    Leukocytes,Ua LARGE (*)    All other components within normal limits  URINALYSIS, MICROSCOPIC (REFLEX) - Abnormal; Notable for the following components:   Bacteria, UA FEW (*)    All other components within normal limits  PREGNANCY, URINE    EKG None  Radiology CT Renal Stone Study  Result Date: 09/26/2020 CLINICAL DATA:  Low back pain, hematuria EXAM: CT ABDOMEN AND PELVIS WITHOUT CONTRAST TECHNIQUE: Multidetector CT imaging of the abdomen and pelvis was performed following the standard protocol without IV contrast. COMPARISON:  None. FINDINGS: Lower chest: Lung bases are clear. No effusions. Heart is normal size. Hepatobiliary: No focal hepatic abnormality. Gallbladder unremarkable. Pancreas: No focal abnormality or ductal dilatation. Spleen: No focal abnormality.  Normal size. Adrenals/Urinary Tract: No adrenal abnormality. No focal renal abnormality. No stones or hydronephrosis. Urinary bladder is unremarkable. Stomach/Bowel: Stomach, large and small bowel grossly unremarkable. Normal appendix. Vascular/Lymphatic: No evidence of aneurysm or adenopathy. Reproductive: Uterus and adnexa unremarkable.  No mass. Other: Trace free fluid in the pelvis, likely physiologic. No free air. Musculoskeletal: No acute bony abnormality. IMPRESSION: No renal or ureteral stones.  No hydronephrosis. No acute findings in the abdomen or pelvis. Electronically Signed   By: Charlett Nose M.D.   On: 09/26/2020 15:53    Procedures Procedures    Medications Ordered in ED Medications - No data to display  ED Course  I have reviewed the triage vital signs and the nursing notes.  Pertinent labs & imaging results that were available during my care of the patient were reviewed by me and considered in my medical decision making (see chart for details).    MDM Rules/Calculators/A&P                           Patient with back pain, likely muscular in origin.  No neurological deficits and normal neuro exam.  Patient is ambulatory.  No loss of bowel or bladder control.  No concern for cauda equina.  No fever, night sweats,  weight loss, h/o cancer, IVDA, no recent procedure to back. Mild dysuria for several days, with leukocytosis and moderate hemoglobin. Not currently on her period. No acute findings on renal CT. Started on keflex, flexeril, naproxen. Supportive care and return precaution discussed. Appears safe for discharge at this time. Follow up as indicated in discharge paperwork.   Final Clinical Impression(s) / ED Diagnoses Final diagnoses:  Dysuria  Acute bilateral low back pain without sciatica    Rx / DC Orders ED Discharge Orders          Ordered    cyclobenzaprine (FLEXERIL) 10 MG tablet  2 times daily PRN        09/26/20 1622    naproxen (NAPROSYN) 375 MG tablet  2 times daily        09/26/20 1622    cephALEXin (KEFLEX) 250 MG capsule  4 times daily        09/26/20 1622             Felicie Morn, NP 09/26/20 1718    Gloris Manchester, MD 09/27/20 1433

## 2021-02-04 ENCOUNTER — Emergency Department (HOSPITAL_BASED_OUTPATIENT_CLINIC_OR_DEPARTMENT_OTHER): Payer: Medicaid Other

## 2021-02-04 ENCOUNTER — Emergency Department (HOSPITAL_BASED_OUTPATIENT_CLINIC_OR_DEPARTMENT_OTHER)
Admission: EM | Admit: 2021-02-04 | Discharge: 2021-02-04 | Disposition: A | Discharge status: Home / Self Care | Payer: Medicaid Other | Attending: Emergency Medicine | Admitting: Emergency Medicine

## 2021-02-04 ENCOUNTER — Other Ambulatory Visit: Payer: Self-pay

## 2021-02-04 ENCOUNTER — Encounter (HOSPITAL_BASED_OUTPATIENT_CLINIC_OR_DEPARTMENT_OTHER): Payer: Self-pay | Admitting: *Deleted

## 2021-02-04 DIAGNOSIS — R1010 Upper abdominal pain, unspecified: Secondary | ICD-10-CM

## 2021-02-04 DIAGNOSIS — O26851 Spotting complicating pregnancy, first trimester: Secondary | ICD-10-CM | POA: Insufficient documentation

## 2021-02-04 DIAGNOSIS — N898 Other specified noninflammatory disorders of vagina: Secondary | ICD-10-CM | POA: Diagnosis not present

## 2021-02-04 DIAGNOSIS — Z3A01 Less than 8 weeks gestation of pregnancy: Secondary | ICD-10-CM | POA: Insufficient documentation

## 2021-02-04 DIAGNOSIS — O26859 Spotting complicating pregnancy, unspecified trimester: Secondary | ICD-10-CM

## 2021-02-04 DIAGNOSIS — N920 Excessive and frequent menstruation with regular cycle: Secondary | ICD-10-CM

## 2021-02-04 LAB — HEPATIC FUNCTION PANEL
ALT: 8 U/L (ref 0–44)
AST: 18 U/L (ref 15–41)
Albumin: 3.7 g/dL (ref 3.5–5.0)
Alkaline Phosphatase: 62 U/L (ref 38–126)
Bilirubin, Direct: 0.1 mg/dL (ref 0.0–0.2)
Indirect Bilirubin: 0.7 mg/dL (ref 0.3–0.9)
Total Bilirubin: 0.8 mg/dL (ref 0.3–1.2)
Total Protein: 7.8 g/dL (ref 6.5–8.1)

## 2021-02-04 LAB — CBC WITH DIFFERENTIAL/PLATELET
Abs Immature Granulocytes: 0.01 10*3/uL (ref 0.00–0.07)
Basophils Absolute: 0 10*3/uL (ref 0.0–0.1)
Basophils Relative: 1 %
Eosinophils Absolute: 0.1 10*3/uL (ref 0.0–0.5)
Eosinophils Relative: 2 %
HCT: 30.7 % — ABNORMAL LOW (ref 36.0–46.0)
Hemoglobin: 10.3 g/dL — ABNORMAL LOW (ref 12.0–15.0)
Immature Granulocytes: 0 %
Lymphocytes Relative: 47 %
Lymphs Abs: 1.9 10*3/uL (ref 0.7–4.0)
MCH: 28.2 pg (ref 26.0–34.0)
MCHC: 33.6 g/dL (ref 30.0–36.0)
MCV: 84.1 fL (ref 80.0–100.0)
Monocytes Absolute: 0.4 10*3/uL (ref 0.1–1.0)
Monocytes Relative: 11 %
Neutro Abs: 1.5 10*3/uL — ABNORMAL LOW (ref 1.7–7.7)
Neutrophils Relative %: 39 %
Platelets: 400 10*3/uL (ref 150–400)
RBC: 3.65 MIL/uL — ABNORMAL LOW (ref 3.87–5.11)
RDW: 14.6 % (ref 11.5–15.5)
WBC: 4.2 10*3/uL (ref 4.0–10.5)
nRBC: 0 % (ref 0.0–0.2)

## 2021-02-04 LAB — HIV ANTIBODY (ROUTINE TESTING W REFLEX): HIV Screen 4th Generation wRfx: NONREACTIVE

## 2021-02-04 LAB — URINALYSIS, ROUTINE W REFLEX MICROSCOPIC
Bilirubin Urine: NEGATIVE
Glucose, UA: NEGATIVE mg/dL
Hgb urine dipstick: NEGATIVE
Ketones, ur: NEGATIVE mg/dL
Leukocytes,Ua: NEGATIVE
Nitrite: NEGATIVE
Protein, ur: 30 mg/dL — AB
Specific Gravity, Urine: 1.02 (ref 1.005–1.030)
pH: 7.5 (ref 5.0–8.0)

## 2021-02-04 LAB — BASIC METABOLIC PANEL
Anion gap: 9 (ref 5–15)
BUN: 11 mg/dL (ref 6–20)
CO2: 22 mmol/L (ref 22–32)
Calcium: 9 mg/dL (ref 8.9–10.3)
Chloride: 104 mmol/L (ref 98–111)
Creatinine, Ser: 0.46 mg/dL (ref 0.44–1.00)
GFR, Estimated: >60 mL/min (ref >60)
Glucose, Bld: 98 mg/dL (ref 70–99)
Potassium: 3.4 mmol/L — ABNORMAL LOW (ref 3.5–5.1)
Sodium: 135 mmol/L (ref 135–145)

## 2021-02-04 LAB — WET PREP, GENITAL
Sperm: NONE SEEN
Trich, Wet Prep: NONE SEEN
WBC, Wet Prep HPF POC: <10 (ref <10)
Yeast Wet Prep HPF POC: NONE SEEN

## 2021-02-04 LAB — ABO/RH: ABO/RH(D): A POS

## 2021-02-04 LAB — URINALYSIS, MICROSCOPIC (REFLEX)

## 2021-02-04 LAB — HCG, QUANTITATIVE, PREGNANCY: hCG, Beta Chain, Quant, S: 4701 m[IU]/mL — ABNORMAL HIGH (ref <5)

## 2021-02-04 LAB — LIPASE, BLOOD: Lipase: 26 U/L (ref 11–51)

## 2021-02-04 LAB — PREGNANCY, URINE: Preg Test, Ur: POSITIVE — AB
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
## 2021-02-04 NOTE — Discharge Instructions (Signed)
You have chosen to leave the emergency department before the completion of your work-up.  This is AGAINST MEDICAL ADVICE.  However, if you wish to return to complete your work-up or as needed for further evaluation, you may return at any time.  Your results will eventually be posted in MyChart.

## 2021-02-05 LAB — GC/CHLAMYDIA PROBE AMP (~~LOC~~) NOT AT ARMC
Chlamydia: NEGATIVE
Comment: NEGATIVE
Comment: NORMAL
Neisseria Gonorrhea: NEGATIVE

## 2021-02-05 LAB — RPR: RPR Ser Ql: NONREACTIVE

## 2021-03-31 ENCOUNTER — Encounter (HOSPITAL_BASED_OUTPATIENT_CLINIC_OR_DEPARTMENT_OTHER): Payer: Self-pay | Admitting: Emergency Medicine

## 2021-03-31 ENCOUNTER — Emergency Department (HOSPITAL_BASED_OUTPATIENT_CLINIC_OR_DEPARTMENT_OTHER)
Admission: EM | Admit: 2021-03-31 | Discharge: 2021-03-31 | Disposition: A | Discharge status: Home / Self Care | Payer: Medicaid Other | Attending: Emergency Medicine | Admitting: Emergency Medicine

## 2021-03-31 ENCOUNTER — Other Ambulatory Visit: Payer: Self-pay

## 2021-03-31 DIAGNOSIS — O2341 Unspecified infection of urinary tract in pregnancy, first trimester: Secondary | ICD-10-CM | POA: Insufficient documentation

## 2021-03-31 DIAGNOSIS — O99810 Abnormal glucose complicating pregnancy: Secondary | ICD-10-CM | POA: Diagnosis not present

## 2021-03-31 DIAGNOSIS — O99111 Other diseases of the blood and blood-forming organs and certain disorders involving the immune mechanism complicating pregnancy, first trimester: Secondary | ICD-10-CM | POA: Insufficient documentation

## 2021-03-31 DIAGNOSIS — Z3A11 11 weeks gestation of pregnancy: Secondary | ICD-10-CM | POA: Insufficient documentation

## 2021-03-31 DIAGNOSIS — E871 Hypo-osmolality and hyponatremia: Secondary | ICD-10-CM | POA: Diagnosis not present

## 2021-03-31 DIAGNOSIS — D72819 Decreased white blood cell count, unspecified: Secondary | ICD-10-CM | POA: Insufficient documentation

## 2021-03-31 DIAGNOSIS — R710 Precipitous drop in hematocrit: Secondary | ICD-10-CM | POA: Diagnosis not present

## 2021-03-31 DIAGNOSIS — O99281 Endocrine, nutritional and metabolic diseases complicating pregnancy, first trimester: Secondary | ICD-10-CM | POA: Insufficient documentation

## 2021-03-31 DIAGNOSIS — R8271 Bacteriuria: Secondary | ICD-10-CM

## 2021-03-31 DIAGNOSIS — B9689 Other specified bacterial agents as the cause of diseases classified elsewhere: Secondary | ICD-10-CM | POA: Diagnosis not present

## 2021-03-31 DIAGNOSIS — O210 Mild hyperemesis gravidarum: Secondary | ICD-10-CM | POA: Insufficient documentation

## 2021-03-31 DIAGNOSIS — O219 Vomiting of pregnancy, unspecified: Secondary | ICD-10-CM | POA: Diagnosis present

## 2021-03-31 LAB — CBC WITH DIFFERENTIAL/PLATELET
Abs Immature Granulocytes: 0.01 10*3/uL (ref 0.00–0.07)
Basophils Absolute: 0 10*3/uL (ref 0.0–0.1)
Basophils Relative: 1 %
Eosinophils Absolute: 0 10*3/uL (ref 0.0–0.5)
Eosinophils Relative: 0 %
HCT: 34.9 % — ABNORMAL LOW (ref 36.0–46.0)
Hemoglobin: 11.8 g/dL — ABNORMAL LOW (ref 12.0–15.0)
Immature Granulocytes: 0 %
Lymphocytes Relative: 29 %
Lymphs Abs: 1.1 10*3/uL (ref 0.7–4.0)
MCH: 28.6 pg (ref 26.0–34.0)
MCHC: 33.8 g/dL (ref 30.0–36.0)
MCV: 84.7 fL (ref 80.0–100.0)
Monocytes Absolute: 0.3 10*3/uL (ref 0.1–1.0)
Monocytes Relative: 8 %
Neutro Abs: 2.4 10*3/uL (ref 1.7–7.7)
Neutrophils Relative %: 62 %
Platelets: 302 10*3/uL (ref 150–400)
RBC: 4.12 MIL/uL (ref 3.87–5.11)
RDW: 14.7 % (ref 11.5–15.5)
WBC: 3.9 10*3/uL — ABNORMAL LOW (ref 4.0–10.5)
nRBC: 0 % (ref 0.0–0.2)

## 2021-03-31 LAB — COMPREHENSIVE METABOLIC PANEL
ALT: 7 U/L (ref 0–44)
AST: 16 U/L (ref 15–41)
Albumin: 3.7 g/dL (ref 3.5–5.0)
Alkaline Phosphatase: 39 U/L (ref 38–126)
Anion gap: 8 (ref 5–15)
BUN: 12 mg/dL (ref 6–20)
CO2: 23 mmol/L (ref 22–32)
Calcium: 9.2 mg/dL (ref 8.9–10.3)
Chloride: 100 mmol/L (ref 98–111)
Creatinine, Ser: 0.55 mg/dL (ref 0.44–1.00)
GFR, Estimated: >60 mL/min (ref >60)
Glucose, Bld: 145 mg/dL — ABNORMAL HIGH (ref 70–99)
Potassium: 3.5 mmol/L (ref 3.5–5.1)
Sodium: 131 mmol/L — ABNORMAL LOW (ref 135–145)
Total Bilirubin: 1 mg/dL (ref 0.3–1.2)
Total Protein: 8 g/dL (ref 6.5–8.1)

## 2021-03-31 LAB — URINALYSIS, MICROSCOPIC (REFLEX)

## 2021-03-31 LAB — URINALYSIS, ROUTINE W REFLEX MICROSCOPIC
Glucose, UA: NEGATIVE mg/dL
Hgb urine dipstick: NEGATIVE
Ketones, ur: >=80 mg/dL — AB
Leukocytes,Ua: NEGATIVE
Nitrite: NEGATIVE
Protein, ur: 30 mg/dL — AB
Specific Gravity, Urine: >=1.03 (ref 1.005–1.030)
pH: 6 (ref 5.0–8.0)

## 2021-03-31 LAB — MAGNESIUM: Magnesium: 1.5 mg/dL — ABNORMAL LOW (ref 1.7–2.4)

## 2021-03-31 MED ORDER — MAGNESIUM SULFATE IN D5W 1-5 GM/100ML-% IV SOLN
1.0000 g | Freq: Once | INTRAVENOUS | Status: AC
  Administered 2021-03-31: 1 g via INTRAVENOUS
  Filled 2021-03-31: qty 100

## 2021-03-31 MED ORDER — LACTATED RINGERS IV BOLUS
1000.0000 mL | Freq: Once | INTRAVENOUS | Status: AC
Start: 2021-03-31 — End: 2021-03-31
  Administered 2021-03-31: 1000 mL via INTRAVENOUS

## 2021-03-31 MED ORDER — PROMETHAZINE HCL 25 MG RE SUPP
25.0000 mg | Freq: Four times a day (QID) | RECTAL | 0 refills | Status: AC | PRN
Start: 2021-03-31 — End: ?

## 2021-03-31 MED ORDER — CEPHALEXIN 500 MG PO CAPS: 500.0000 mg | ORAL_CAPSULE | Freq: Three times a day (TID) | ORAL | 0 refills | Status: DC

## 2021-03-31 MED ORDER — LACTATED RINGERS IV BOLUS
1000.0000 mL | Freq: Once | INTRAVENOUS | Status: AC
  Administered 2021-03-31: 1000 mL via INTRAVENOUS

## 2021-03-31 MED ORDER — PROMETHAZINE HCL 25 MG PO TABS: 25.0000 mg | ORAL_TABLET | Freq: Four times a day (QID) | ORAL | 0 refills | Status: DC | PRN

## 2021-03-31 MED ORDER — MAGNESIUM SULFATE 2 GM/50ML IV SOLN: 2.0000 g | Freq: Once | INTRAVENOUS | Status: DC

## 2021-03-31 MED ORDER — PROMETHAZINE HCL 25 MG PO TABS: 25.0000 mg | ORAL_TABLET | Freq: Four times a day (QID) | ORAL | 0 refills | Status: AC | PRN

## 2021-03-31 MED ORDER — METOCLOPRAMIDE HCL 5 MG/ML IJ SOLN
10.0000 mg | Freq: Once | INTRAMUSCULAR | Status: AC
  Administered 2021-03-31: 10 mg via INTRAVENOUS
  Filled 2021-03-31: qty 2

## 2021-03-31 MED ORDER — PROMETHAZINE HCL 25 MG RE SUPP: 25.0000 mg | Freq: Four times a day (QID) | RECTAL | 0 refills | Status: DC | PRN

## 2021-03-31 NOTE — Discharge Instructions (Addendum)
You were seen in the emergency department for her nausea and vomiting in pregnancy.  You received some fluids and magnesium which was low.  Your urine showed some bacteria and it so we are starting you on some antibiotics.  It appears to [place] that your only nausea medication right now is likely just , please take promethazine for persistent nausea.   We prefer that you take tablet as your first-line therapy, but if unable to tolerate the tablet you can take the rectal suppository that is prescribed. ? ?please follow-up with your OB team.  ?

## 2021-03-31 NOTE — ED Provider Notes (Addendum)
?[facility] EMERGENCY DEPARTMENT ?Provider Note ? ? ?CSN: [id number] ?Arrival date & time: 03/31/21  1306 ? ?  ? ?History ? ?Chief Complaint  ?Patient presents with  ? Emesis During Pregnancy  ? ? ?Rebecca [name] is a 21 y.o. female.  G1 P0 [redacted] weeks pregnant.  She is here with a complaint of worsening of her nausea and vomiting over the past week.  The medications that her OB has her on do not seem to be helping much.  It sounds like she is taking Diclegis and possibly Compazine.  No fevers or chills.  No abdominal pain or urinary symptoms.  No vaginal bleeding or discharge.  She is currently eating [employer] as I am enter the room.  She said her doctor told her to try to keep some food on her stomach. ? ?The history is provided by the patient.  ?Emesis ?Severity:  Moderate ?Duration:  1 week ?Timing:  Sporadic ?Number of daily episodes:  2 ?Quality:  Stomach contents ?Progression:  Unchanged ?Chronicity:  Recurrent ?Recent urination:  Normal ?Relieved by:  Nothing ?Worsened by:  Nothing ?Ineffective treatments:  Antiemetics ?Associated symptoms: no abdominal pain, no cough, no diarrhea, no fever, no headaches and no sore throat   ?Risk factors: pregnant   ? ?  ? ?Home Medications ?Prior to Admission medications   ?Medication Sig Start Date End Date Taking? Authorizing Provider  ?cephALEXin (KEFLEX) 250 MG capsule Take 1 capsule (250 mg total) by mouth 4 (four) times daily. ?Patient not taking: Reported on 02/04/2021 09/26/20   Felicie Morn, NP  ?cyclobenzaprine (FLEXERIL) 10 MG tablet Take 1 tablet (10 mg total) by mouth 2 (two) times daily as needed for muscle spasms. ?Patient not taking: Reported on 02/04/2021 09/26/20   Felicie Morn, NP  ?fluticasone [facility]) 50 MCG/ACT nasal spray Place 1 spray into both nostrils as needed for allergies. 08/16/18   [provider]  ?hydrOXYzine (ATARAX/VISTARIL) 25 MG tablet Take 1 tablet (25 mg total) by mouth every 6 (six) hours. 08/19/18   Henderly, Britni A, PA-C   ?naproxen (NAPROSYN) 375 MG tablet Take 1 tablet (375 mg total) by mouth 2 (two) times daily. ?Patient not taking: Reported on 02/04/2021 09/26/20   Felicie Morn, NP  ?naproxen (NAPROSYN) 500 MG tablet Take 500 mg by mouth every 12 (twelve) hours as needed for cramping. 08/16/18   [provider]  ?ondansetron (ZOFRAN ODT) 4 MG disintegrating tablet Take 1 tablet (4 mg total) by mouth every 6 (six) hours as needed for nausea or vomiting. ?Patient not taking: Reported on 02/04/2021 05/31/16   Lowanda Foster, NP  ?Prenat-FeAsp-Meth-FA-DHA w/o A (PRENATE PIXIE) 10-0.6-0.4-200 MG CAPS Take 1 capsule by mouth daily. 02/01/21   [provider]  ?   ? ?Allergies    ?Patient has no known allergies.   ? ?Review of Systems   ?Review of Systems  ?Constitutional:  Negative for fever.  ?HENT:  Negative for sore throat.   ?Respiratory:  Negative for cough.   ?Gastrointestinal:  Positive for vomiting. Negative for abdominal pain and diarrhea.  ?Genitourinary:  Negative for dysuria.  ?Neurological:  Negative for headaches.  ? ?Physical Exam ?Updated Vital Signs ?BP 128/80 (BP Location: Left Arm)   Pulse 75   Temp 98.2 ?F (36.8 ?C) (Oral)   Resp 18   LMP 12/30/2020   SpO2 100%  ?Physical Exam ?Vitals and nursing note reviewed.  ?Constitutional:   ?   General: She is not in acute distress. ?   Appearance:  Normal appearance. She is well-developed.  ?HENT:  ?   Head: Normocephalic and atraumatic.  ?Eyes:  ?   Conjunctiva/sclera: Conjunctivae normal.  ?Cardiovascular:  ?   Rate and Rhythm: Normal rate and regular rhythm.  ?   Heart sounds: No murmur heard. ?Pulmonary:  ?   Effort: Pulmonary effort is normal. No respiratory distress.  ?   Breath sounds: Normal breath sounds.  ?Abdominal:  ?   Palpations: Abdomen is soft.  ?   Tenderness: There is no abdominal tenderness. There is no guarding or rebound.  ?Musculoskeletal:     ?   General: No swelling. Normal range of motion.  ?   Cervical back: Neck supple.  ?Skin: ?    General: Skin is warm and dry.  ?   Capillary Refill: Capillary refill takes less than 2 seconds.  ?Neurological:  ?   General: No focal deficit present.  ?   Mental Status: She is alert.  ? ? ?ED Results / Procedures / Treatments   ?Labs ?(all labs ordered are listed, but only abnormal results are displayed) ?Labs Reviewed  ?COMPREHENSIVE METABOLIC PANEL - Abnormal; Notable for the following components:  ?    Result Value  ? Sodium 131 (*)   ? Glucose, Bld 145 (*)   ? All other components within normal limits  ?CBC WITH DIFFERENTIAL/PLATELET - Abnormal; Notable for the following components:  ? WBC 3.9 (*)   ? Hemoglobin 11.8 (*)   ? HCT 34.9 (*)   ? All other components within normal limits  ?MAGNESIUM - Abnormal; Notable for the following components:  ? Magnesium 1.5 (*)   ? All other components within normal limits  ?URINALYSIS, ROUTINE W REFLEX MICROSCOPIC - Abnormal; Notable for the following components:  ? APPearance HAZY (*)   ? Bilirubin Urine SMALL (*)   ? Ketones, ur >=80 (*)   ? Protein, ur 30 (*)   ? All other components within normal limits  ?URINALYSIS, MICROSCOPIC (REFLEX) - Abnormal; Notable for the following components:  ? Bacteria, UA MANY (*)   ? All other components within normal limits  ?URINE CULTURE  ? ? ?EKG ?None ? ?Radiology ?No results found. ? ?Procedures ?Procedures  ? ? ?Medications Ordered in ED ?Medications  ?lactated ringers bolus 1,000 mL ( Intravenous Stopped 03/31/21 1521)  ?metoCLOPramide (REGLAN) injection 10 mg (10 mg Intravenous Given 03/31/21 1443)  ?lactated ringers bolus 1,000 mL (1,000 mLs Intravenous New Bag/Given 03/31/21 1523)  ?magnesium sulfate IVPB 1 g 100 mL (0 g Intravenous Stopped 03/31/21 1626)  ? ? ?ED Course/ Medical Decision Making/ A&P ?  ?                        ?Medical Decision Making ?Amount and/or Complexity of Data Reviewed ?Labs: ordered. ? ?Risk ?Prescription drug management. ? ?This patient complains of nausea and vomiting; this involves an extensive  number of treatment ?Options and is a complaint that carries with it a high risk of complications and ?morbidity. The differential includes hyperemesis gravidarum, dehydration, metabolic derangement ? ?I ordered, reviewed and interpreted labs, which included CBC with mildly low white count, low hemoglobin, chemistries with low sodium elevated glucose, urinalysis with bacteriuria, magnesium level ?I ordered medication IV fluids and nausea medication, IV magnesium and reviewed PMP when indicated. ?Additional history obtained from patient significant other ?Previous records obtained and reviewed in epic, patient was here in January for spotting ?  ?Social determinants considered, no significant barriers ?Critical  Interventions: None ? ?After the interventions stated above, I reevaluated the patient and found patient be symptomatically improved ?Admission and further testing considered, plan is for discharge after completion of IV fluids and magnesium.  Her care is signed out to oncoming provider Dr. Rhunette CroftNanavati to reassess patient after fluids. ? ? ? ? ? ? ? ? ? ?Final Clinical Impression(s) / ED Diagnoses ?Final diagnoses:  ?Hyperemesis gravidarum  ?Bacteriuria during pregnancy  ?Hypomagnesemia  ? ? ?Rx / DC Orders ?ED Discharge Orders   ? ?      Ordered  ?  cephALEXin (KEFLEX) 500 MG capsule  3 times daily       ? 03/31/21 1450  ? ?  ?  ? ?  ? ? ?  ?Terrilee FilesButler, Verner Kopischke C, MD ?03/31/21 1704 ? ?  ?Terrilee FilesButler, Jawaan Adachi C, MD ?03/31/21 1704 ? ?

## 2021-03-31 NOTE — ED Provider Notes (Signed)
?  Physical Exam  ?BP 104/60 (BP Location: Left Arm)   Pulse 65   Temp 98.2 ?F (36.8 ?C) (Oral)   Resp 16   LMP 12/30/2020   SpO2 100%  ? ?Physical Exam ? ?Procedures  ?Procedures ? ?ED Course / MDM  ?  ?Medical Decision Making ?Amount and/or Complexity of Data Reviewed ?Labs: ordered. ? ?Risk ?Prescription drug management. ? ? ?Assuming care of patient from Dr. Charm Barges ?  ?Patient in the ED for n/v in her 1st pregnancy ?Workup thus far shows - ketonuria, bacteuria ? ?Concerning findings are as following : none ?Important pending results are : none ? ?According to Dr. Charm Barges, plan is to f/u with patient. She is receiving po challenge.  ? ?Patient had no complains, no concerns from the nursing side. Will continue to monitor. ? ? ?5:41 PM ? ?Patient has passed oral challenge. ?She has received IV fluid. ? ?Discussed the results 1 more time with her.  She will start taking the antibiotics that have been prescribed for her bacteriuria given that she is pregnant.  She will also be prescribed promethazine.  She is already taking diclegis. ?Return precautions discussed. ? ? ? ? ?  ?Derwood Kaplan, MD ?03/31/21 1743 ? ?

## 2021-03-31 NOTE — ED Triage Notes (Signed)
Pt reports her home nausea medication for pregnancy related emesis has not been working for the past 4 days. Tries to eat something but ends up throwing up 2 hours later. Pt reports she is [redacted] weeks pregnant.  ?

## 2021-03-31 NOTE — ED Notes (Signed)
Called lab @ 1458 to add on urine culture. ?

## 2021-03-31 NOTE — ED Notes (Signed)
ED Provider at bedside. 

## 2021-04-01 LAB — URINE CULTURE: Culture: <10000 — AB

## 2021-04-19 ENCOUNTER — Other Ambulatory Visit: Payer: Self-pay

## 2021-04-19 ENCOUNTER — Encounter (HOSPITAL_BASED_OUTPATIENT_CLINIC_OR_DEPARTMENT_OTHER): Payer: Self-pay | Admitting: Emergency Medicine

## 2021-04-19 ENCOUNTER — Emergency Department (HOSPITAL_BASED_OUTPATIENT_CLINIC_OR_DEPARTMENT_OTHER): Payer: Medicaid Other

## 2021-04-19 ENCOUNTER — Emergency Department (HOSPITAL_BASED_OUTPATIENT_CLINIC_OR_DEPARTMENT_OTHER)
Admission: EM | Admit: 2021-04-19 | Discharge: 2021-04-19 | Disposition: A | Discharge status: Home / Self Care | Payer: Medicaid Other | Attending: Emergency Medicine | Admitting: Emergency Medicine

## 2021-04-19 DIAGNOSIS — E162 Hypoglycemia, unspecified: Secondary | ICD-10-CM | POA: Diagnosis not present

## 2021-04-19 DIAGNOSIS — O26892 Other specified pregnancy related conditions, second trimester: Secondary | ICD-10-CM | POA: Insufficient documentation

## 2021-04-19 DIAGNOSIS — Z3A15 15 weeks gestation of pregnancy: Secondary | ICD-10-CM | POA: Diagnosis not present

## 2021-04-19 DIAGNOSIS — N9489 Other specified conditions associated with female genital organs and menstrual cycle: Secondary | ICD-10-CM | POA: Diagnosis not present

## 2021-04-19 DIAGNOSIS — R1084 Generalized abdominal pain: Secondary | ICD-10-CM | POA: Diagnosis not present

## 2021-04-19 DIAGNOSIS — R0789 Other chest pain: Secondary | ICD-10-CM | POA: Insufficient documentation

## 2021-04-19 DIAGNOSIS — R42 Dizziness and giddiness: Secondary | ICD-10-CM | POA: Insufficient documentation

## 2021-04-19 DIAGNOSIS — R109 Unspecified abdominal pain: Secondary | ICD-10-CM

## 2021-04-19 LAB — URINALYSIS, MICROSCOPIC (REFLEX)

## 2021-04-19 LAB — COMPREHENSIVE METABOLIC PANEL
ALT: 5 U/L (ref 0–44)
AST: 12 U/L — ABNORMAL LOW (ref 15–41)
Albumin: 3.7 g/dL (ref 3.5–5.0)
Alkaline Phosphatase: 38 U/L (ref 38–126)
Anion gap: 8 (ref 5–15)
BUN: 7 mg/dL (ref 6–20)
CO2: 24 mmol/L (ref 22–32)
Calcium: 9.2 mg/dL (ref 8.9–10.3)
Chloride: 106 mmol/L (ref 98–111)
Creatinine, Ser: 0.6 mg/dL (ref 0.44–1.00)
GFR, Estimated: >60 mL/min (ref >60)
Glucose, Bld: 52 mg/dL — ABNORMAL LOW (ref 70–99)
Potassium: 3.7 mmol/L (ref 3.5–5.1)
Sodium: 138 mmol/L (ref 135–145)
Total Bilirubin: 0.4 mg/dL (ref 0.3–1.2)
Total Protein: 6.9 g/dL (ref 6.5–8.1)

## 2021-04-19 LAB — CBC WITH DIFFERENTIAL/PLATELET
Abs Immature Granulocytes: 0.01 10*3/uL (ref 0.00–0.07)
Basophils Absolute: 0 10*3/uL (ref 0.0–0.1)
Basophils Relative: 1 %
Eosinophils Absolute: 0 10*3/uL (ref 0.0–0.5)
Eosinophils Relative: 1 %
HCT: 32.9 % — ABNORMAL LOW (ref 36.0–46.0)
Hemoglobin: 11 g/dL — ABNORMAL LOW (ref 12.0–15.0)
Immature Granulocytes: 0 %
Lymphocytes Relative: 32 %
Lymphs Abs: 1.3 10*3/uL (ref 0.7–4.0)
MCH: 29.1 pg (ref 26.0–34.0)
MCHC: 33.4 g/dL (ref 30.0–36.0)
MCV: 87 fL (ref 80.0–100.0)
Monocytes Absolute: 0.4 10*3/uL (ref 0.1–1.0)
Monocytes Relative: 9 %
Neutro Abs: 2.4 10*3/uL (ref 1.7–7.7)
Neutrophils Relative %: 57 %
Platelets: 259 10*3/uL (ref 150–400)
RBC: 3.78 MIL/uL — ABNORMAL LOW (ref 3.87–5.11)
RDW: 14.6 % (ref 11.5–15.5)
WBC: 4.1 10*3/uL (ref 4.0–10.5)
nRBC: 0 % (ref 0.0–0.2)

## 2021-04-19 LAB — URINALYSIS, ROUTINE W REFLEX MICROSCOPIC
Bilirubin Urine: NEGATIVE
Glucose, UA: NEGATIVE mg/dL
Hgb urine dipstick: NEGATIVE
Ketones, ur: NEGATIVE mg/dL
Nitrite: NEGATIVE
Protein, ur: NEGATIVE mg/dL
Specific Gravity, Urine: >=1.03 (ref 1.005–1.030)
pH: 6 (ref 5.0–8.0)

## 2021-04-19 LAB — CBG MONITORING, ED
Glucose-Capillary: 51 mg/dL — ABNORMAL LOW (ref 70–99)
Glucose-Capillary: 90 mg/dL (ref 70–99)

## 2021-04-19 LAB — TROPONIN I (HIGH SENSITIVITY): Troponin I (High Sensitivity): <2 ng/L (ref <18)

## 2021-04-19 LAB — PREGNANCY, URINE: Preg Test, Ur: POSITIVE — AB

## 2021-04-19 LAB — LIPASE, BLOOD: Lipase: 27 U/L (ref 11–51)

## 2021-04-19 LAB — HCG, QUANTITATIVE, PREGNANCY: hCG, Beta Chain, Quant, S: 28966 m[IU]/mL — ABNORMAL HIGH (ref <5)

## 2021-04-19 MED ORDER — SODIUM CHLORIDE 0.9 % IV BOLUS: 1000.0000 mL | Freq: Once | INTRAVENOUS | Status: DC

## 2021-04-19 NOTE — ED Notes (Signed)
Pt given specimen cup, unable to void at this time.  

## 2021-04-19 NOTE — ED Provider Notes (Signed)
?[facility] EMERGENCY DEPARTMENT ?Provider Note ? ? ?CSN: [id number] ?Arrival date & time: 04/19/21  1152 ? ?  ? ?History ?PMH: g1p0 ~ [redacted] weeks pregnant, Migraine, anxiety, anemia ?Chief Complaint  ?Patient presents with  ? Near Syncope  ? ? ?Rebecca [name] is a 21 y.o. female. ?Patient presents to the ED with a chief complaint of lightheadedness.  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  She proceeded to sit down and felt slightly better, however she still has persistent lightheadedness.  She says that she ate a small breakfast today with a piece of toast, and she usually eats a much larger breakfast.  She also has been hydrating herself well.  She is following with OB/GYN for this current pregnancy.  Has not had any pregnancy related problems so far.  She does note that she had some vaginal spotting yesterday morning as well as some intermittent mid abdominal pain.  She also states that today she has had some intermittent left flank pain that shoots up into her upper back.  These pains have waxed and waned.  She has had some associated nausea and had 2 episodes of emesis this morning that is abnormal for her.  She is not nauseous right now.  She also states that she has had some chest discomfort for about 2 hours.  Says this in the middle of her chest, does not radiate, feels like pressure, has been intermittent.  He denies any shortness of breath, dysuria, hematuria, fevers, chills, diarrhea, constipation, headaches, or leg swelling. ? ?Near Syncope ?Associated symptoms include chest pain and abdominal pain. Pertinent negatives include no headaches and no shortness of breath.  ? ?  ? ?Home Medications ?Prior to Admission medications   ?Medication Sig Start Date End Date Taking? Authorizing Provider  ?cephALEXin (KEFLEX) 500 MG capsule Take 1 capsule (500 mg total) by mouth 3 (three) times daily. 03/31/21   Derwood Kaplan, MD   ?cyclobenzaprine (FLEXERIL) 10 MG tablet Take 1 tablet (10 mg total) by mouth 2 (two) times daily as needed for muscle spasms. ?Patient not taking: Reported on 02/04/2021 09/26/20   Felicie Morn, NP  ?fluticasone [facility]) 50 MCG/ACT nasal spray Place 1 spray into both nostrils as needed for allergies. 08/16/18   [provider]  ?hydrOXYzine (ATARAX/VISTARIL) 25 MG tablet Take 1 tablet (25 mg total) by mouth every 6 (six) hours. 08/19/18   Henderly, Britni A, PA-C  ?naproxen (NAPROSYN) 375 MG tablet Take 1 tablet (375 mg total) by mouth 2 (two) times daily. ?Patient not taking: Reported on 02/04/2021 09/26/20   Felicie Morn, NP  ?naproxen (NAPROSYN) 500 MG tablet Take 500 mg by mouth every 12 (twelve) hours as needed for cramping. 08/16/18   [provider]  ?ondansetron (ZOFRAN ODT) 4 MG disintegrating tablet Take 1 tablet (4 mg total) by mouth every 6 (six) hours as needed for nausea or vomiting. ?Patient not taking: Reported on 02/04/2021 05/31/16   Lowanda Foster, NP  ?Prenat-FeAsp-Meth-FA-DHA w/o A (PRENATE PIXIE) 10-0.6-0.4-200 MG CAPS Take 1 capsule by mouth daily. 02/01/21   [provider]  ?promethazine (PHENERGAN) 25 MG suppository Place 1 suppository (25 mg total) rectally every 6 (six) hours as needed for nausea. 03/31/21   Derwood Kaplan, MD  ?promethazine (PHENERGAN) 25 MG tablet Take 1 tablet (25 mg total) by mouth every 6 (six) hours as needed for nausea or vomiting. 03/31/21   Derwood Kaplan, MD  ?   ? ?  Allergies    ?Patient has no known allergies.   ? ?Review of Systems   ?Review of Systems  ?Constitutional:  Negative for chills and fever.  ?HENT:  Negative for congestion, ear pain, rhinorrhea and sore throat.   ?Eyes:  Negative for visual disturbance.  ?Respiratory:  Negative for cough and shortness of breath.   ?Cardiovascular:  Positive for chest pain and near-syncope. Negative for leg swelling.  ?Gastrointestinal:  Positive for abdominal pain, nausea and vomiting. Negative for  abdominal distention, blood in stool, constipation and diarrhea.  ?Genitourinary:  Positive for flank pain and vaginal bleeding. Negative for dysuria, hematuria, pelvic pain and vaginal discharge.  ?Musculoskeletal:  Positive for back pain.  ?Neurological:  Positive for dizziness and light-headedness. Negative for syncope and headaches.  ?All other systems reviewed and are negative. ? ?Physical Exam ?Updated Vital Signs ?BP 103/71 (BP Location: Right Arm)   Pulse 66   Temp 98.4 ?F (36.9 ?C) (Oral)   Resp 18   Ht 5\' 6"  (1.676 m)   Wt 89.4 kg   LMP 12/30/2020   SpO2 100%   BMI 31.80 kg/m?  ?Physical Exam ?Vitals and nursing note reviewed.  ?Constitutional:   ?   General: She is not in acute distress. ?   Appearance: Normal appearance. She is not ill-appearing, toxic-appearing or diaphoretic.  ?HENT:  ?   Head: Normocephalic and atraumatic.  ?   Nose: No nasal deformity.  ?   Mouth/Throat:  ?   Lips: Pink. No lesions.  ?   Mouth: Mucous membranes are moist. No injury, lacerations, oral lesions or angioedema.  ?   Pharynx: Oropharynx is clear. Uvula midline. No pharyngeal swelling, oropharyngeal exudate, posterior oropharyngeal erythema or uvula swelling.  ?Eyes:  ?   General: Gaze aligned appropriately. No scleral icterus.    ?   Right eye: No discharge.     ?   Left eye: No discharge.  ?   Conjunctiva/sclera: Conjunctivae normal.  ?   Right eye: Right conjunctiva is not injected. No exudate or hemorrhage. ?   Left eye: Left conjunctiva is not injected. No exudate or hemorrhage. ?   Pupils: Pupils are equal, round, and reactive to light.  ?Cardiovascular:  ?   Rate and Rhythm: Normal rate and regular rhythm.  ?   Pulses: Normal pulses.     ?     Radial pulses are 2+ on the right side and 2+ on the left side.  ?     Dorsalis pedis pulses are 2+ on the right side and 2+ on the left side.  ?   Heart sounds: Normal heart sounds, S1 normal and S2 normal. Heart sounds not distant. No murmur heard. ?  No friction rub.  No gallop. No S3 or S4 sounds.  ?Pulmonary:  ?   Effort: Pulmonary effort is normal. No accessory muscle usage or respiratory distress.  ?   Breath sounds: Normal breath sounds. No stridor. No wheezing, rhonchi or rales.  ?Chest:  ?   Chest wall: No tenderness.  ?Abdominal:  ?   General: Abdomen is flat. There is no distension.  ?   Palpations: Abdomen is soft. There is no mass or pulsatile mass.  ?   Tenderness: There is abdominal tenderness. There is left CVA tenderness. There is no right CVA tenderness, guarding or rebound.  ?   Comments: Vague generalized abdominal tenderness. Not really focal. No guarding or rigidity. There was some L CVA tenderness.   ?Musculoskeletal:  ?  Cervical back: Neck supple. No rigidity.  ?   Right lower leg: No edema.  ?   Left lower leg: No edema.  ?Skin: ?   General: Skin is warm and dry.  ?   Coloration: Skin is not jaundiced or pale.  ?   Findings: No bruising, erythema, lesion or rash.  ?Neurological:  ?   General: No focal deficit present.  ?   Mental Status: She is alert and oriented to person, place, and time.  ?   GCS: GCS eye subscore is 4. GCS verbal subscore is 5. GCS motor subscore is 6.  ?Psychiatric:     ?   Mood and Affect: Mood normal.     ?   Behavior: Behavior normal. Behavior is cooperative.  ? ? ?ED Results / Procedures / Treatments   ?Labs ?(all labs ordered are listed, but only abnormal results are displayed) ?Labs Reviewed  ?CBC WITH DIFFERENTIAL/PLATELET - Abnormal; Notable for the following components:  ?    Result Value  ? RBC 3.78 (*)   ? Hemoglobin 11.0 (*)   ? HCT 32.9 (*)   ? All other components within normal limits  ?PREGNANCY, URINE - Abnormal; Notable for the following components:  ? Preg Test, Ur POSITIVE (*)   ? All other components within normal limits  ?COMPREHENSIVE METABOLIC PANEL - Abnormal; Notable for the following components:  ? Glucose, Bld 52 (*)   ? AST 12 (*)   ? All other components within normal limits  ?URINALYSIS, ROUTINE W REFLEX  MICROSCOPIC - Abnormal; Notable for the following components:  ? Leukocytes,Ua SMALL (*)   ? All other components within normal limits  ?HCG, QUANTITATIVE, PREGNANCY - Abnormal; Notable for the following compo

## 2021-04-19 NOTE — ED Notes (Signed)
ED Provider at bedside. 

## 2021-04-19 NOTE — Discharge Instructions (Addendum)
Please follow up with your OBGYN. Have them follow up on your urine culture that was sent. Your ultrasound was normal today. You were found to have a low glucose which could have caused most of your symptoms. If you continue to experience worsening symptoms through the weekend, please return to the ED. ?

## 2021-04-19 NOTE — ED Notes (Signed)
Taking in POs well, drinking water, juices and one soda, tolerated all well, no complaints of nausea, or vomiting.  ?

## 2021-04-19 NOTE — ED Triage Notes (Signed)
Was standing at work, felt dizzy , near syncope.  4 month-pregnant .  ?

## 2021-04-19 NOTE — ED Notes (Signed)
CBG of 51mg /dl noted, 8oz of OJ consumed, will reck in ?

## 2021-04-20 LAB — ABO/RH: ABO/RH(D): A POS

## 2021-04-21 LAB — URINE CULTURE

## 2021-05-26 DIAGNOSIS — O43199 Other malformation of placenta, unspecified trimester: Secondary | ICD-10-CM | POA: Insufficient documentation

## 2021-06-16 ENCOUNTER — Encounter (HOSPITAL_COMMUNITY): Payer: Self-pay | Admitting: Obstetrics and Gynecology

## 2021-06-16 ENCOUNTER — Inpatient Hospital Stay (HOSPITAL_COMMUNITY)
Admission: AD | Admit: 2021-06-16 | Discharge: 2021-06-16 | Disposition: A | Discharge status: Home / Self Care | Payer: Medicaid Other | Attending: Obstetrics and Gynecology | Admitting: Obstetrics and Gynecology

## 2021-06-16 DIAGNOSIS — Z3A24 24 weeks gestation of pregnancy: Secondary | ICD-10-CM | POA: Diagnosis not present

## 2021-06-16 DIAGNOSIS — O26892 Other specified pregnancy related conditions, second trimester: Secondary | ICD-10-CM | POA: Diagnosis not present

## 2021-06-16 DIAGNOSIS — R1010 Upper abdominal pain, unspecified: Secondary | ICD-10-CM | POA: Insufficient documentation

## 2021-06-16 DIAGNOSIS — R12 Heartburn: Secondary | ICD-10-CM | POA: Diagnosis not present

## 2021-06-16 LAB — CBC
HCT: 30.2 % — ABNORMAL LOW (ref 36.0–46.0)
Hemoglobin: 10.3 g/dL — ABNORMAL LOW (ref 12.0–15.0)
MCH: 30.7 pg (ref 26.0–34.0)
MCHC: 34.1 g/dL (ref 30.0–36.0)
MCV: 89.9 fL (ref 80.0–100.0)
Platelets: 256 10*3/uL (ref 150–400)
RBC: 3.36 MIL/uL — ABNORMAL LOW (ref 3.87–5.11)
RDW: 12.5 % (ref 11.5–15.5)
WBC: 3.7 10*3/uL — ABNORMAL LOW (ref 4.0–10.5)
nRBC: 0 % (ref 0.0–0.2)

## 2021-06-16 LAB — URINALYSIS, ROUTINE W REFLEX MICROSCOPIC
Bilirubin Urine: NEGATIVE
Glucose, UA: NEGATIVE mg/dL
Hgb urine dipstick: NEGATIVE
Ketones, ur: NEGATIVE mg/dL
Nitrite: NEGATIVE
Protein, ur: NEGATIVE mg/dL
Specific Gravity, Urine: 1.012 (ref 1.005–1.030)
pH: 7 (ref 5.0–8.0)

## 2021-06-16 LAB — COMPREHENSIVE METABOLIC PANEL
ALT: 6 U/L (ref 0–44)
AST: 13 U/L — ABNORMAL LOW (ref 15–41)
Albumin: 2.7 g/dL — ABNORMAL LOW (ref 3.5–5.0)
Alkaline Phosphatase: 46 U/L (ref 38–126)
Anion gap: 5 (ref 5–15)
BUN: <5 mg/dL — ABNORMAL LOW (ref 6–20)
CO2: 23 mmol/L (ref 22–32)
Calcium: 8.5 mg/dL — ABNORMAL LOW (ref 8.9–10.3)
Chloride: 107 mmol/L (ref 98–111)
Creatinine, Ser: 0.58 mg/dL (ref 0.44–1.00)
GFR, Estimated: >60 mL/min (ref >60)
Glucose, Bld: 101 mg/dL — ABNORMAL HIGH (ref 70–99)
Potassium: 3.6 mmol/L (ref 3.5–5.1)
Sodium: 135 mmol/L (ref 135–145)
Total Bilirubin: 0.3 mg/dL (ref 0.3–1.2)
Total Protein: 6.2 g/dL — ABNORMAL LOW (ref 6.5–8.1)

## 2021-06-16 MED ORDER — FAMOTIDINE 20 MG PO TABS: 20.0000 mg | ORAL_TABLET | Freq: Every day | ORAL | 0 refills | Status: DC

## 2021-06-16 NOTE — MAU Note (Signed)
Judeth Horn, NP implementing bedside U/S due to difficulty obtaining FHR on EFM.  Per Denny Peon, NP discontinue EFM as fetal well being confirmed.    Leonette Nutting  06/16/21

## 2021-06-16 NOTE — MAU Note (Signed)
.  Rebecca [name] is a 21 y.o. at [redacted]w[redacted]d here in MAU reporting: abdominal pain that started last night while she sleeping.  Pt reports that it is a throbbing pain that is in the mid section of her stomach.  No VB or discharge.  +FM   Onset of complaint: while she was sleeping last night Pain score: 5  Vitals:   06/16/21 1324  BP: 117/64  Pulse: 93  Resp: 18  Temp: 98.4 F (36.9 C)  SpO2: 100%     FHT:140  Lab orders placed from triage:   UA

## 2021-06-16 NOTE — MAU Provider Note (Signed)
History     [id number]  Arrival date and time: 06/16/21 1251    Chief Complaint  Patient presents with   Abdominal Pain     HPI Rebecca [name] is a 21 y.o. at [redacted]w[redacted]d who presents for abdominal pain. Symptoms started yesterday. Reports mid upper abdominal pain that occurs intermittently & lasts for 20 minutes at a time. No aggravating factors. Hasn't treated symptoms. Currently not hurting. States sometimes she vomits after eating. Denies fever, dysuria, vaginal bleeding, lower abdominal pain, or LOF. Reports good fetal movement. Has appointment with her OB in [facility] tomorrow.    OB History     Gravida  1   Para      Term      Preterm      AB      Living         SAB      IAB      Ectopic      Multiple      Live Births              Past Medical History:  Diagnosis Date   Anemia    Anxiety    Depression    Migraine    Obesity     Past Surgical History:  Procedure Laterality Date   NO PAST SURGERIES      Family History  Problem Relation Age of Onset   Anemia Mother    Depression Mother    Insomnia Mother    Asthma Father    ADD / ADHD Father    Bipolar disorder Father    Bipolar disorder Maternal Grandmother    Asthma Maternal Grandmother    Diabetes Paternal Grandmother     No Known Allergies  No current facility-administered medications on file prior to encounter.   Current Outpatient Medications on File Prior to Encounter  Medication Sig Dispense Refill   acetaminophen (TYLENOL) 500 MG tablet Take 500 mg by mouth every 6 (six) hours as needed.     Prenat-FeAsp-Meth-FA-DHA w/o A (PRENATE PIXIE) 10-0.6-0.4-200 MG CAPS Take 1 capsule by mouth daily.     cephALEXin (KEFLEX) 500 MG capsule Take 1 capsule (500 mg total) by mouth 3 (three) times daily. 15 capsule 0   cyclobenzaprine (FLEXERIL) 10 MG tablet Take 1 tablet (10 mg total) by mouth 2 (two) times daily as needed for muscle spasms. (Patient not taking: Reported on 02/04/2021) 20 tablet 0    fluticasone (FLONASE) 50 MCG/ACT nasal spray Place 1 spray into both nostrils as needed for allergies.     hydrOXYzine (ATARAX/VISTARIL) 25 MG tablet Take 1 tablet (25 mg total) by mouth every 6 (six) hours. 12 tablet 0   naproxen (NAPROSYN) 375 MG tablet Take 1 tablet (375 mg total) by mouth 2 (two) times daily. (Patient not taking: Reported on 02/04/2021) 20 tablet 0   naproxen (NAPROSYN) 500 MG tablet Take 500 mg by mouth every 12 (twelve) hours as needed for cramping.     ondansetron (ZOFRAN ODT) 4 MG disintegrating tablet Take 1 tablet (4 mg total) by mouth every 6 (six) hours as needed for nausea or vomiting. (Patient not taking: Reported on 02/04/2021) 10 tablet 0   promethazine (PHENERGAN) 25 MG suppository Place 1 suppository (25 mg total) rectally every 6 (six) hours as needed for nausea. 12 each 0   promethazine (PHENERGAN) 25 MG tablet Take 1 tablet (25 mg total) by mouth every 6 (six) hours as needed for nausea or vomiting. 20 tablet 0  ROS Pertinent positives and negative per HPI, all others reviewed and negative  Physical Exam   BP 109/61 (BP Location: Right Arm)   Pulse 89   Temp 98.4 F (36.9 C) (Oral)   Resp 17   Wt 92.1 kg   LMP 12/17/2020   SpO2 100%   BMI 32.78 kg/m   Patient Vitals for the past 24 hrs:  BP Temp Temp src Pulse Resp SpO2 Weight  06/16/21 1529 109/61 -- Oral 89 17 100 % --  06/16/21 1324 117/64 98.4 F (36.9 C) Oral 93 18 100 % 92.1 kg    Physical Exam Vitals and nursing note reviewed. Exam conducted with a chaperone present.  Constitutional:      General: She is not in acute distress.    Appearance: She is well-developed.  HENT:     Head: Normocephalic and atraumatic.  Eyes:     General: No scleral icterus.    Pupils: Pupils are equal, round, and reactive to light.  Pulmonary:     Effort: Pulmonary effort is normal. No respiratory distress.  Abdominal:     Palpations: Abdomen is soft.     Tenderness: There is no abdominal  tenderness. There is no guarding or rebound.  Skin:    General: Skin is warm and dry.  Neurological:     Mental Status: She is alert.     Cervical Exam Dilation: Closed Effacement (%): Thick Cervical Position: Posterior Station: Ballotable Exam by:: Judeth Horn NP  Bedside Ultrasound Pt informed that the ultrasound is considered a limited OB ultrasound and is not intended to be a complete ultrasound exam.  Patient also informed that the ultrasound is not being completed with the intent of assessing for fetal or placental anomalies or any pelvic abnormalities.  Explained that the purpose of today's ultrasound is to assess for  viability.  Patient acknowledges the purpose of the exam and the limitations of the study.     My interpretation: Active fetus in breech presentation. FHR present, 130s. Subjectively normal fluid.    Labs Results for orders placed or performed during the hospital encounter of 06/16/21 (from the past 24 hour(s))  Urinalysis, Routine w reflex microscopic Urine, Clean Catch     Status: Abnormal   Collection Time: 06/16/21  1:09 PM  Result Value Ref Range   Color, Urine YELLOW YELLOW   APPearance HAZY (A) CLEAR   Specific Gravity, Urine 1.012 1.005 - 1.030   pH 7.0 5.0 - 8.0   Glucose, UA NEGATIVE NEGATIVE mg/dL   Hgb urine dipstick NEGATIVE NEGATIVE   Bilirubin Urine NEGATIVE NEGATIVE   Ketones, ur NEGATIVE NEGATIVE mg/dL   Protein, ur NEGATIVE NEGATIVE mg/dL   Nitrite NEGATIVE NEGATIVE   Leukocytes,Ua LARGE (A) NEGATIVE   RBC / HPF 0-5 0 - 5 RBC/hpf   WBC, UA 11-20 0 - 5 WBC/hpf   Bacteria, UA RARE (A) NONE SEEN   Squamous Epithelial / LPF 0-5 0 - 5   Mucus PRESENT   CBC     Status: Abnormal   Collection Time: 06/16/21  2:17 PM  Result Value Ref Range   WBC 3.7 (L) 4.0 - 10.5 K/uL   RBC 3.36 (L) 3.87 - 5.11 MIL/uL   Hemoglobin 10.3 (L) 12.0 - 15.0 g/dL   HCT 33.8 (L) 25.0 - 53.9 %   MCV 89.9 80.0 - 100.0 fL   MCH 30.7 26.0 - 34.0 pg   MCHC 34.1  30.0 - 36.0 g/dL   RDW 76.7 34.1 -  15.5 %   Platelets 256 150 - 400 K/uL   nRBC 0.0 0.0 - 0.2 %  Comprehensive metabolic panel     Status: Abnormal   Collection Time: 06/16/21  2:17 PM  Result Value Ref Range   Sodium 135 135 - 145 mmol/L   Potassium 3.6 3.5 - 5.1 mmol/L   Chloride 107 98 - 111 mmol/L   CO2 23 22 - 32 mmol/L   Glucose, Bld 101 (H) 70 - 99 mg/dL   BUN <5 (L) 6 - 20 mg/dL   Creatinine, Ser 1.610.58 0.44 - 1.00 mg/dL   Calcium 8.5 (L) 8.9 - 10.3 mg/dL   Total Protein 6.2 (L) 6.5 - 8.1 g/dL   Albumin 2.7 (L) 3.5 - 5.0 g/dL   AST 13 (L) 15 - 41 U/L   ALT 6 0 - 44 U/L   Alkaline Phosphatase 46 38 - 126 U/L   Total Bilirubin 0.3 0.3 - 1.2 mg/dL   GFR, Estimated >09>60 >60>60 mL/min   Anion gap 5 5 - 15    Imaging No results found.  MAU Course  Procedures Lab Orders         Urinalysis, Routine w reflex microscopic Urine, Clean Catch         CBC         Comprehensive metabolic panel     Meds ordered this encounter  Medications   famotidine (PEPCID) 20 MG tablet    Sig: Take 1 tablet (20 mg total) by mouth daily.    Dispense:  30 tablet    Refill:  0    Order Specific Question:   Supervising Provider    Answer:   Milas HockDUNCAN, PAULA [[id number]]   Imaging Orders  No imaging studies ordered today    MDM Patient presents with intermittent abdominal pain that currently is not occurring. Abdomen soft & non tender. Labs ordered & reviewed. Based on description & location of pain, suspect gastric in nature & recommend starting daily ant acid  Unable to obtain NST due to active fetus & early gestational age. Bedside ultrasound performed - see documentation under physical exam  Reviewed prenatal records through [facility] under care everywhere Assessment and Plan   1. Heartburn during pregnancy in second trimester   2. [redacted] weeks gestation of pregnancy    -Rx pepcid -Reviewed reasons to return to MAU -Keep f/u with OB tomorrow  Judeth HornErin Duane Earnshaw, NP 06/16/21 3:31 PM

## 2021-06-26 ENCOUNTER — Encounter (HOSPITAL_COMMUNITY): Payer: Self-pay | Admitting: Obstetrics & Gynecology

## 2021-06-26 ENCOUNTER — Observation Stay (HOSPITAL_COMMUNITY)
Admission: AD | Admit: 2021-06-26 | Discharge: 2021-06-27 | Disposition: A | Discharge status: Home / Self Care | Payer: Medicaid Other | Attending: Obstetrics & Gynecology | Admitting: Obstetrics & Gynecology

## 2021-06-26 ENCOUNTER — Other Ambulatory Visit: Payer: Self-pay

## 2021-06-26 ENCOUNTER — Inpatient Hospital Stay (HOSPITAL_BASED_OUTPATIENT_CLINIC_OR_DEPARTMENT_OTHER): Payer: Medicaid Other

## 2021-06-26 DIAGNOSIS — O9A212 Injury, poisoning and certain other consequences of external causes complicating pregnancy, second trimester: Secondary | ICD-10-CM | POA: Diagnosis present

## 2021-06-26 DIAGNOSIS — S3991XA Unspecified injury of abdomen, initial encounter: Secondary | ICD-10-CM | POA: Diagnosis not present

## 2021-06-26 DIAGNOSIS — Z79899 Other long term (current) drug therapy: Secondary | ICD-10-CM | POA: Diagnosis not present

## 2021-06-26 DIAGNOSIS — O9A213 Injury, poisoning and certain other consequences of external causes complicating pregnancy, third trimester: Secondary | ICD-10-CM | POA: Diagnosis not present

## 2021-06-26 DIAGNOSIS — O9A313 Physical abuse complicating pregnancy, third trimester: Secondary | ICD-10-CM

## 2021-06-26 DIAGNOSIS — O0992 Supervision of high risk pregnancy, unspecified, second trimester: Secondary | ICD-10-CM | POA: Diagnosis not present

## 2021-06-26 DIAGNOSIS — Z3A25 25 weeks gestation of pregnancy: Secondary | ICD-10-CM

## 2021-06-26 DIAGNOSIS — Y92009 Unspecified place in unspecified non-institutional (private) residence as the place of occurrence of the external cause: Secondary | ICD-10-CM | POA: Diagnosis not present

## 2021-06-26 LAB — URINALYSIS, ROUTINE W REFLEX MICROSCOPIC
Bilirubin Urine: NEGATIVE
Glucose, UA: 50 mg/dL — AB
Hgb urine dipstick: NEGATIVE
Ketones, ur: NEGATIVE mg/dL
Nitrite: NEGATIVE
Protein, ur: >=300 mg/dL — AB
Specific Gravity, Urine: 1.018 (ref 1.005–1.030)
WBC, UA: >50 WBC/hpf — ABNORMAL HIGH (ref 0–5)
pH: 5 (ref 5.0–8.0)

## 2021-06-26 LAB — CBC
HCT: 31.9 % — ABNORMAL LOW (ref 36.0–46.0)
Hemoglobin: 11 g/dL — ABNORMAL LOW (ref 12.0–15.0)
MCH: 30.6 pg (ref 26.0–34.0)
MCHC: 34.5 g/dL (ref 30.0–36.0)
MCV: 88.9 fL (ref 80.0–100.0)
Platelets: 273 10*3/uL (ref 150–400)
RBC: 3.59 MIL/uL — ABNORMAL LOW (ref 3.87–5.11)
RDW: 12.5 % (ref 11.5–15.5)
WBC: 5.2 10*3/uL (ref 4.0–10.5)
nRBC: 0 % (ref 0.0–0.2)

## 2021-06-26 LAB — TYPE AND SCREEN
ABO/RH(D): A POS
Antibody Screen: NEGATIVE

## 2021-06-26 MED ORDER — CYCLOBENZAPRINE HCL 10 MG PO TABS
10.0000 mg | ORAL_TABLET | Freq: Three times a day (TID) | ORAL | Status: DC | PRN
Start: 2021-06-26 — End: 2021-06-27
  Administered 2021-06-26: 10 mg via ORAL
  Filled 2021-06-26: qty 1

## 2021-06-26 MED ORDER — ZOLPIDEM TARTRATE 5 MG PO TABS: 5.0000 mg | ORAL_TABLET | Freq: Every evening | ORAL | Status: DC | PRN

## 2021-06-26 MED ORDER — OXYCODONE HCL 5 MG PO TABS: 5.0000 mg | ORAL_TABLET | Freq: Four times a day (QID) | ORAL | Status: DC | PRN

## 2021-06-26 MED ORDER — FLUTICASONE PROPIONATE 50 MCG/ACT NA SUSP: 1.0000 | NASAL | Status: DC | PRN

## 2021-06-26 MED ORDER — DOCUSATE SODIUM 100 MG PO CAPS
100.0000 mg | ORAL_CAPSULE | Freq: Every day | ORAL | Status: DC
  Administered 2021-06-26: 100 mg via ORAL
  Filled 2021-06-26: qty 1

## 2021-06-26 MED ORDER — CALCIUM CARBONATE ANTACID 500 MG PO CHEW: 2.0000 | CHEWABLE_TABLET | ORAL | Status: DC | PRN

## 2021-06-26 MED ORDER — PRENATAL MULTIVITAMIN CH
1.0000 | ORAL_TABLET | Freq: Every day | ORAL | Status: DC
  Filled 2021-06-26: qty 1

## 2021-06-26 MED ORDER — ACETAMINOPHEN 500 MG PO TABS: 1000.0000 mg | ORAL_TABLET | Freq: Four times a day (QID) | ORAL | Status: DC | PRN

## 2021-06-26 MED ORDER — COMPLETENATE 29-1 MG PO CHEW: 1.0000 | CHEWABLE_TABLET | Freq: Every day | ORAL | Status: DC

## 2021-06-26 MED ORDER — HYDROXYZINE HCL 50 MG PO TABS
25.0000 mg | ORAL_TABLET | Freq: Four times a day (QID) | ORAL | Status: DC
  Administered 2021-06-26 (×2): 25 mg via ORAL
  Filled 2021-06-26 (×4): qty 1
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
## 2021-06-27 DIAGNOSIS — Y92009 Unspecified place in unspecified non-institutional (private) residence as the place of occurrence of the external cause: Secondary | ICD-10-CM | POA: Diagnosis not present

## 2021-06-27 DIAGNOSIS — O9A212 Injury, poisoning and certain other consequences of external causes complicating pregnancy, second trimester: Secondary | ICD-10-CM | POA: Diagnosis not present

## 2021-06-27 DIAGNOSIS — Z3A25 25 weeks gestation of pregnancy: Secondary | ICD-10-CM

## 2021-06-27 MED ORDER — CYCLOBENZAPRINE HCL 10 MG PO TABS: 10.0000 mg | ORAL_TABLET | Freq: Three times a day (TID) | ORAL | 0 refills | Status: AC | PRN

## 2021-06-27 NOTE — Progress Notes (Signed)
RN x3 stood at bedside adjusting monitors from 0610-0700. Unable to get a clear tracing of FHR. Brief tracings available for last hour. Raelyn Ensign, RN
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
## 2021-07-22 ENCOUNTER — Inpatient Hospital Stay (HOSPITAL_COMMUNITY)
Admission: AD | Admit: 2021-07-22 | Discharge: 2021-07-22 | Disposition: A | Discharge status: Home / Self Care | Payer: Medicaid Other | Attending: Family Medicine | Admitting: Family Medicine

## 2021-07-22 ENCOUNTER — Encounter (HOSPITAL_COMMUNITY): Payer: Self-pay | Admitting: Family Medicine

## 2021-07-22 DIAGNOSIS — R0789 Other chest pain: Secondary | ICD-10-CM | POA: Insufficient documentation

## 2021-07-22 DIAGNOSIS — B999 Unspecified infectious disease: Secondary | ICD-10-CM | POA: Diagnosis not present

## 2021-07-22 DIAGNOSIS — N3000 Acute cystitis without hematuria: Secondary | ICD-10-CM | POA: Insufficient documentation

## 2021-07-22 DIAGNOSIS — O26893 Other specified pregnancy related conditions, third trimester: Secondary | ICD-10-CM | POA: Diagnosis not present

## 2021-07-22 DIAGNOSIS — Z3A29 29 weeks gestation of pregnancy: Secondary | ICD-10-CM

## 2021-07-22 DIAGNOSIS — O2313 Infections of bladder in pregnancy, third trimester: Secondary | ICD-10-CM | POA: Diagnosis not present

## 2021-07-22 DIAGNOSIS — O368130 Decreased fetal movements, third trimester, not applicable or unspecified: Secondary | ICD-10-CM | POA: Insufficient documentation

## 2021-07-22 DIAGNOSIS — M549 Dorsalgia, unspecified: Secondary | ICD-10-CM | POA: Diagnosis not present

## 2021-07-22 DIAGNOSIS — R12 Heartburn: Secondary | ICD-10-CM | POA: Diagnosis not present

## 2021-07-22 LAB — URINALYSIS, ROUTINE W REFLEX MICROSCOPIC
Bilirubin Urine: NEGATIVE
Glucose, UA: NEGATIVE mg/dL
Hgb urine dipstick: NEGATIVE
Ketones, ur: NEGATIVE mg/dL
Nitrite: NEGATIVE
Protein, ur: NEGATIVE mg/dL
Specific Gravity, Urine: 1.012 (ref 1.005–1.030)
pH: 7 (ref 5.0–8.0)

## 2021-07-22 LAB — WET PREP, GENITAL
Clue Cells Wet Prep HPF POC: NONE SEEN
Sperm: NONE SEEN
Trich, Wet Prep: NONE SEEN
WBC, Wet Prep HPF POC: >=10 — AB (ref <10)
Yeast Wet Prep HPF POC: NONE SEEN

## 2021-07-22 MED ORDER — CEFADROXIL 500 MG PO CAPS: 500.0000 mg | ORAL_CAPSULE | Freq: Two times a day (BID) | ORAL | 0 refills | Status: AC

## 2021-07-22 MED ORDER — PANTOPRAZOLE SODIUM 40 MG PO TBEC
40.0000 mg | DELAYED_RELEASE_TABLET | Freq: Once | ORAL | Status: AC
  Administered 2021-07-22: 40 mg via ORAL
  Filled 2021-07-22: qty 1

## 2021-07-22 MED ORDER — FAMOTIDINE 20 MG PO TABS: 20.0000 mg | ORAL_TABLET | Freq: Every day | ORAL | 3 refills | Status: AC

## 2021-07-22 NOTE — MAU Note (Addendum)
.  Rebecca [name] is a 21 y.o. at [redacted]w[redacted]d here in MAU reporting: lower back and abdominal pain that started yesterday. She states that the back pain is constant and feels like cramping. Today she began having sharp chest pain moving from one side to the other that "trades off" with her abdominal pain. She has not taken anything for the pain. Denies VB or LOF. Having decreased fetal movement today. Is having an increase in thin yellow discharge. States since arriving in MAU she now has a headache 4/10, NPO since 2100 last pm.  Pain score: 5 Vitals:   07/22/21 1103  BP: 102/63  Pulse: 98  Resp: 18  Temp: 98.1 F (36.7 C)  SpO2: 100%     FHT:145 Lab orders placed from triage:  UA

## 2021-07-22 NOTE — MAU Provider Note (Signed)
History     CSN: [id number]  Arrival date and time: 07/22/21 1056   Event Date/Time   First Provider Initiated Contact with Patient 07/22/21 1129      Chief Complaint  Patient presents with   Abdominal Pain   Back Pain   Chest Pain   Decreased Fetal Movement   HPI This is a 21 year old G1 P0 at 29 weeks and 4 days who presents with pelvic pain that has been going on for the past couple of days.  The pain is pretty constant with no palliating or provoking factors.  She also has some thick yellow vaginal discharge.  Additionally, she is complaining of bilateral intermittent chest pain with no palliating or provoking factors.  Denies cough, fevers, chills, shortness of breath, pleurisy.  This started earlier this morning.  Pain is fairly mild, but she wanted to get this checked out.  OB History     Gravida  1   Para      Term      Preterm      AB      Living         SAB      IAB      Ectopic      Multiple      Live Births              Past Medical History:  Diagnosis Date   Anemia    Anxiety    Depression    Migraine    Obesity     Past Surgical History:  Procedure Laterality Date   NO PAST SURGERIES      Family History  Problem Relation Age of Onset   Anemia Mother    Depression Mother    Insomnia Mother    Varicose Veins Father    Asthma Father    ADD / ADHD Father    Bipolar disorder Father    Bipolar disorder Maternal Grandmother    Asthma Maternal Grandmother    Diabetes Paternal Grandmother     Social History   Tobacco Use   Smoking status: Never   Smokeless tobacco: Never  Vaping Use   Vaping Use: Former  Substance Use Topics   Alcohol use: No   Drug use: Not Currently    Types: Marijuana    Allergies: No Known Allergies  Medications Prior to Admission  Medication Sig Dispense Refill Last Dose   acetaminophen (TYLENOL) 500 MG tablet Take 500 mg by mouth every 6 (six) hours as needed.      cyclobenzaprine  (FLEXERIL) 10 MG tablet Take 1 tablet (10 mg total) by mouth 3 (three) times daily as needed for muscle spasms. 30 tablet 0    famotidine (PEPCID) 20 MG tablet Take 1 tablet (20 mg total) by mouth daily. (Patient taking differently: Take 20 mg by mouth daily. Pt has not started yet) 30 tablet 0    fluticasone (FLONASE) 50 MCG/ACT nasal spray Place 1 spray into both nostrils as needed for allergies.      hydrOXYzine (ATARAX/VISTARIL) 25 MG tablet Take 1 tablet (25 mg total) by mouth every 6 (six) hours. 12 tablet 0    Prenat-FeAsp-Meth-FA-DHA w/o A (PRENATE PIXIE) 10-0.6-0.4-200 MG CAPS Take 1 capsule by mouth daily.      promethazine (PHENERGAN) 25 MG suppository Place 1 suppository (25 mg total) rectally every 6 (six) hours as needed for nausea. 12 each 0    promethazine (PHENERGAN) 25 MG tablet Take 1 tablet (25 mg total)  by mouth every 6 (six) hours as needed for nausea or vomiting. 20 tablet 0     Review of Systems Physical Exam   Blood pressure 109/69, pulse 83, temperature 98.1 F (36.7 C), temperature source Oral, resp. rate 18, last menstrual period 12/17/2020, SpO2 99 %.  Physical Exam Constitutional:      Appearance: She is well-developed.  Cardiovascular:     Rate and Rhythm: Normal rate and regular rhythm.  Pulmonary:     Effort: Pulmonary effort is normal.     Breath sounds: Normal breath sounds.  Abdominal:     General: Abdomen is flat.     Palpations: Abdomen is soft.     Comments: Tenderness on the round ligaments, in particular right side.  Skin:    General: Skin is warm and dry.     Capillary Refill: Capillary refill takes less than 2 seconds.  Neurological:     Mental Status: She is alert.  Psychiatric:        Mood and Affect: Mood normal.        Behavior: Behavior normal.    Results for orders placed or performed during the hospital encounter of 07/22/21 (from the past 24 hour(s))  Urinalysis, Routine w reflex microscopic Urine, Clean Catch     Status:  Abnormal   Collection Time: 07/22/21 11:12 AM  Result Value Ref Range   Color, Urine YELLOW YELLOW   APPearance HAZY (A) CLEAR   Specific Gravity, Urine 1.012 1.005 - 1.030   pH 7.0 5.0 - 8.0   Glucose, UA NEGATIVE NEGATIVE mg/dL   Hgb urine dipstick NEGATIVE NEGATIVE   Bilirubin Urine NEGATIVE NEGATIVE   Ketones, ur NEGATIVE NEGATIVE mg/dL   Protein, ur NEGATIVE NEGATIVE mg/dL   Nitrite NEGATIVE NEGATIVE   Leukocytes,Ua MODERATE (A) NEGATIVE   RBC / HPF 0-5 0 - 5 RBC/hpf   WBC, UA 11-20 0 - 5 WBC/hpf   Bacteria, UA RARE (A) NONE SEEN   Squamous Epithelial / LPF 0-5 0 - 5   Mucus PRESENT   Wet prep, genital     Status: Abnormal   Collection Time: 07/22/21 11:37 AM   Specimen: Vaginal  Result Value Ref Range   Yeast Wet Prep HPF POC NONE SEEN NONE SEEN   Trich, Wet Prep NONE SEEN NONE SEEN   Clue Cells Wet Prep HPF POC NONE SEEN NONE SEEN   WBC, Wet Prep HPF POC >=10 (A) <10   Sperm NONE SEEN      MAU Course  Procedures  MDM Check EKG, but low concern for cardiac etiology.  Will give protonix PO  Vaginal cultures.  Assessment and Plan   1. [redacted] weeks gestation of pregnancy   2. Heartburn during pregnancy in third trimester   3. Acute cystitis without hematuria    It appears that she has beginnings of a urinary tract infection.  We will treat with cefadroxil. Pepcid for heartburn We will discharge to home with follow-up outpatient  Levie Heritage 07/22/2021, 11:29 AM

## 2021-07-23 LAB — GC/CHLAMYDIA PROBE AMP (~~LOC~~) NOT AT ARMC
Chlamydia: POSITIVE — AB
Comment: NEGATIVE
Comment: NORMAL
Neisseria Gonorrhea: NEGATIVE

## 2021-07-24 ENCOUNTER — Other Ambulatory Visit: Payer: Self-pay | Admitting: Family Medicine

## 2021-07-24 LAB — URINE CULTURE: Culture: 20000 — AB

## 2021-07-24 MED ORDER — AZITHROMYCIN 500 MG PO TABS: 1000.0000 mg | ORAL_TABLET | Freq: Once | ORAL | 1 refills | Status: AC

## 2021-07-24 NOTE — Progress Notes (Signed)
Called patient with positive chlamydia results. Azithromycin sent to the pharmacy with expedited partner treatment. Discussed that can take the azithromycin a couple hours apart to help with nausea. Advised that partner also needs to be treated. No intercourse for 2 weeks following treatment. Patient acknowledged.

## 2021-09-27 ENCOUNTER — Emergency Department (HOSPITAL_BASED_OUTPATIENT_CLINIC_OR_DEPARTMENT_OTHER)
Admission: EM | Admit: 2021-09-27 | Discharge: 2021-09-28 | Disposition: A | Discharge status: Home / Self Care | Payer: Medicaid Other | Attending: Emergency Medicine | Admitting: Emergency Medicine

## 2021-09-27 ENCOUNTER — Other Ambulatory Visit: Payer: Self-pay

## 2021-09-27 ENCOUNTER — Encounter (HOSPITAL_BASED_OUTPATIENT_CLINIC_OR_DEPARTMENT_OTHER): Payer: Self-pay | Admitting: *Deleted

## 2021-09-27 DIAGNOSIS — R519 Headache, unspecified: Secondary | ICD-10-CM | POA: Diagnosis present

## 2021-09-27 DIAGNOSIS — G43409 Hemiplegic migraine, not intractable, without status migrainosus: Secondary | ICD-10-CM | POA: Insufficient documentation

## 2021-09-27 MED ORDER — DIPHENHYDRAMINE HCL 50 MG/ML IJ SOLN
25.0000 mg | Freq: Once | INTRAMUSCULAR | Status: AC
  Administered 2021-09-28: 25 mg via INTRAVENOUS
  Filled 2021-09-27: qty 1

## 2021-09-27 MED ORDER — METOCLOPRAMIDE HCL 5 MG/ML IJ SOLN
10.0000 mg | Freq: Once | INTRAMUSCULAR | Status: AC
  Administered 2021-09-28: 10 mg via INTRAVENOUS
  Filled 2021-09-27: qty 2

## 2021-09-27 MED ORDER — SODIUM CHLORIDE 0.9 % IV BOLUS
1000.0000 mL | Freq: Once | INTRAVENOUS | Status: AC
  Administered 2021-09-28: 1000 mL via INTRAVENOUS

## 2021-09-27 NOTE — ED Triage Notes (Addendum)
Pt had a baby on 9/2.  Pt states that baby was born at 37w 1d.  Pt reports that she had nausea and vomiting during pregnancy.  Pt is here due to severe headache and feeling dizzy today.  No sob.  Pt reports minimal vaginal bleeding.  Pt was concerned about hypertension and checked her BP at home x2 and it was 127/90 and 128/94.  No CP with this and no sob.

## 2021-09-27 NOTE — ED Provider Notes (Signed)
[facility] Provider Note: Rebecca Dell, MD, FACEP  CSN: [id number] MRN: [id number] ARRIVAL: 09/27/21 at 2149 ROOM: MH11/MH11   CHIEF COMPLAINT  Headache   HISTORY OF PRESENT ILLNESS  09/27/21 11:27 PM Rebecca [name] is a 21 y.o. female who gave birth on 09/14/2021 by C-section.  The baby was born at 37 weeks 1 day.  She is here due to a headache for the past 4 days and feeling dizzy today.  The headache onset was gradual and is worsened.  It is primarily on the right-hand side of the head.  She does have a history of migraines.  She denies nausea or vomiting but does have photophobia with this.  She has taken ibuprofen without relief.   Past Medical History:  Diagnosis Date   Anemia    Anxiety    Depression    Migraine    Obesity     Past Surgical History:  Procedure Laterality Date   CESAREAN SECTION     NO PAST SURGERIES      Family History  Problem Relation Age of Onset   Anemia Mother    Depression Mother    Insomnia Mother    Varicose Veins Father    Asthma Father    ADD / ADHD Father    Bipolar disorder Father    Bipolar disorder Maternal Grandmother    Asthma Maternal Grandmother    Diabetes Paternal Grandmother     Social History   Tobacco Use   Smoking status: Never   Smokeless tobacco: Never  Vaping Use   Vaping Use: Former  Substance Use Topics   Alcohol use: No   Drug use: Not Currently    Types: Marijuana    Prior to Admission medications   Medication Sig Start Date End Date Taking? Authorizing Provider  acetaminophen (TYLENOL) 500 MG tablet Take 500 mg by mouth every 6 (six) hours as needed.    [provider]  cefadroxil (DURICEF) 500 MG capsule Take 1 capsule (500 mg total) by mouth 2 (two) times daily. 07/22/21   Levie Heritage, DO  cyclobenzaprine (FLEXERIL) 10 MG tablet Take 1 tablet (10 mg total) by mouth 3 (three) times daily as needed for muscle spasms. 06/27/21   Anyanwu, Jethro Bastos, MD  famotidine (PEPCID) 20 MG  tablet Take 1 tablet (20 mg total) by mouth daily. Pt has not started yet 07/22/21   Levie Heritage, DO  fluticasone [facility]) 50 MCG/ACT nasal spray Place 1 spray into both nostrils as needed for allergies. 08/16/18   [provider]  hydrOXYzine (ATARAX/VISTARIL) 25 MG tablet Take 1 tablet (25 mg total) by mouth every 6 (six) hours. 08/19/18   Henderly, Britni A, PA-C  Prenat-FeAsp-Meth-FA-DHA w/o A (PRENATE PIXIE) 10-0.6-0.4-200 MG CAPS Take 1 capsule by mouth daily. 02/01/21   [provider]  promethazine (PHENERGAN) 25 MG suppository Place 1 suppository (25 mg total) rectally every 6 (six) hours as needed for nausea. 03/31/21   Derwood Kaplan, MD  promethazine (PHENERGAN) 25 MG tablet Take 1 tablet (25 mg total) by mouth every 6 (six) hours as needed for nausea or vomiting. 03/31/21   Derwood Kaplan, MD    Allergies Patient has no known allergies.   REVIEW OF SYSTEMS  Negative except as noted here or in the History of Present Illness.   PHYSICAL EXAMINATION  Initial Vital Signs Blood pressure 129/86, pulse 87, temperature 98.1 F (36.7 C), temperature source Oral, resp. rate 14, weight 101.2 kg, last menstrual period  12/17/2020, SpO2 100 %, currently breastfeeding.  Examination General: Well-developed, well-nourished female in no acute distress; appearance consistent with age of record HENT: normocephalic; atraumatic Eyes: pupils equal, round and reactive to light; extraocular muscles intact; photophobia Neck: supple Heart: regular rate and rhythm Lungs: clear to auscultation bilaterally Abdomen: soft; nondistended; nontender; bowel sounds present Extremities: No deformity; full range of motion; pulses normal Neurologic: Awake, alert and oriented; motor function intact in all extremities and symmetric; no facial droop Skin: Warm and dry Psychiatric: Normal mood and affect   RESULTS  Summary of this visit's results, reviewed and interpreted by myself:   EKG  Interpretation  Date/Time:    Ventricular Rate:    PR Interval:    QRS Duration:   QT Interval:    QTC Calculation:   R Axis:     Text Interpretation:         Laboratory Studies: No results found for this or any previous visit (from the past 24 hour(s)). Imaging Studies: No results found.  ED COURSE and MDM  Nursing notes, initial and subsequent vitals signs, including pulse oximetry, reviewed and interpreted by myself.  Vitals:   09/27/21 2159 09/27/21 2201 09/27/21 2332 09/28/21 0030  BP: 126/84 129/86 139/89 134/79  Pulse: 85 87 84 81  Resp: 15 14 15 16   Temp:  98.1 F (36.7 C)    TempSrc:  Oral    SpO2: 100% 100% 99% 99%  Weight:       Medications  sodium chloride 0.9 % bolus 1,000 mL (0 mLs Intravenous Stopped 09/28/21 0035)  diphenhydrAMINE (BENADRYL) injection 25 mg (25 mg Intravenous Given 09/28/21 0001)  metoCLOPramide (REGLAN) injection 10 mg (10 mg Intravenous Given 09/28/21 0001)   12:52 AM Headache relieved with IV fluids and medications.  I suspect this represents a migraine as it was hemiplegic and she does have a personal and family history of migraines.   PROCEDURES  Procedures   ED DIAGNOSES     ICD-10-CM   1. Sporadic migraine  G43.409          Alina Gilkey, MD 09/28/21 [phone]

## 2022-01-28 IMAGING — US US OB < 14 WEEKS - US OB TV
1 series · 14 of 28 positions shown · non-contrast
Comparison: None.

CLINICAL DATA: Spotting

EXAM:
OBSTETRIC <14 WK US AND TRANSVAGINAL OB US
TECHNIQUE: Both transabdominal and transvaginal ultrasound examinations were
performed for complete evaluation of the gestation as well as the
maternal uterus, adnexal regions, and pelvic cul-de-sac.
Transvaginal technique was performed to assess early pregnancy.

[Series 1: us ob < 14 weeks - us ob tv · 70 acquisitions, 14 frames shown]
[im 3/70]
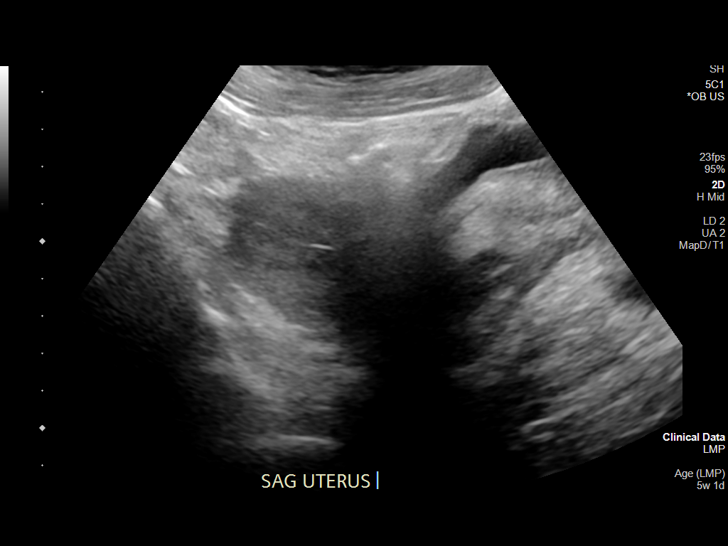
[im 8/70]
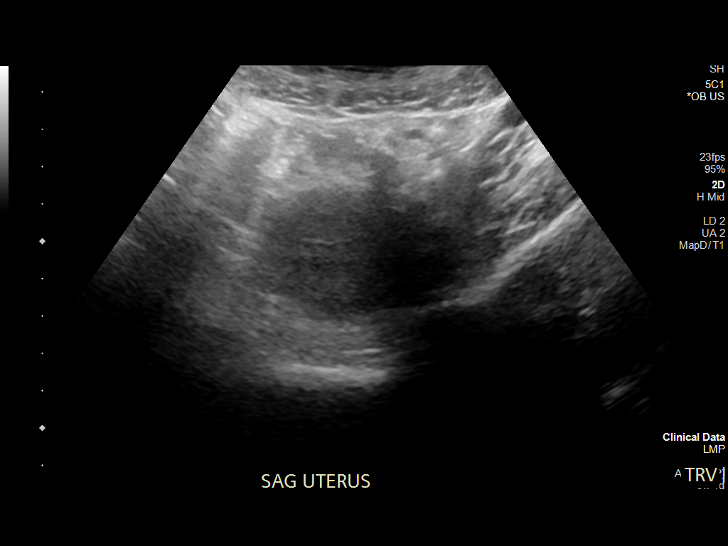
[im 13/70]
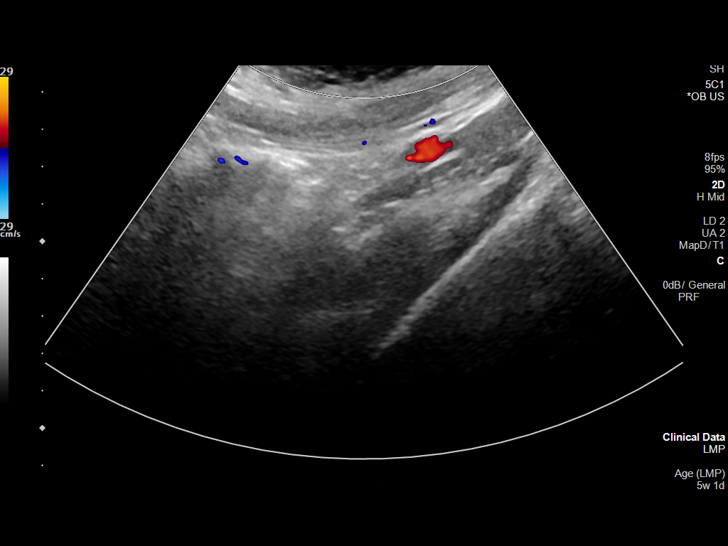
[im 18/70]
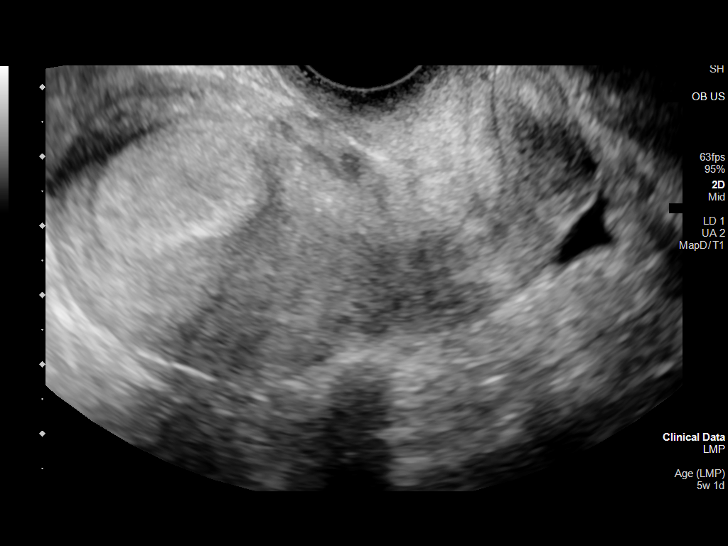
[im 24/70]
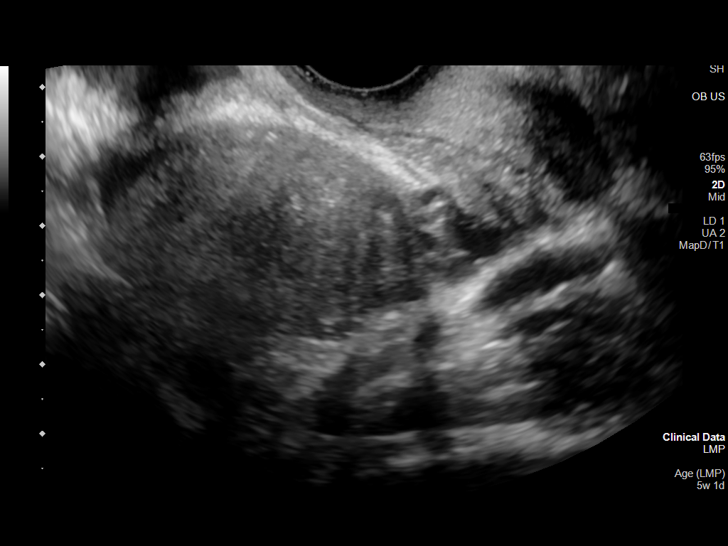
[im 29/70]
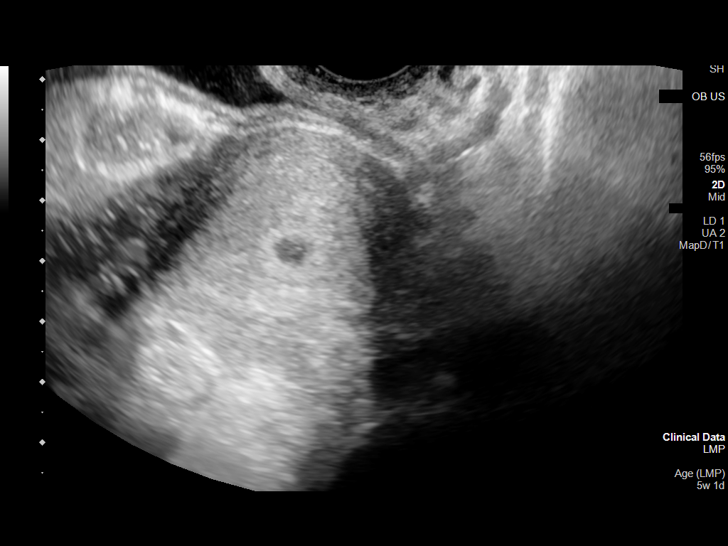
[im 34/70]
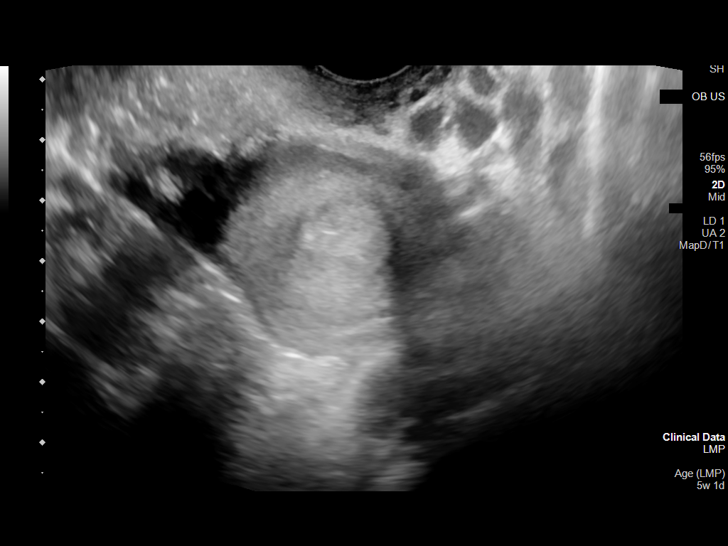
[im 39/70]
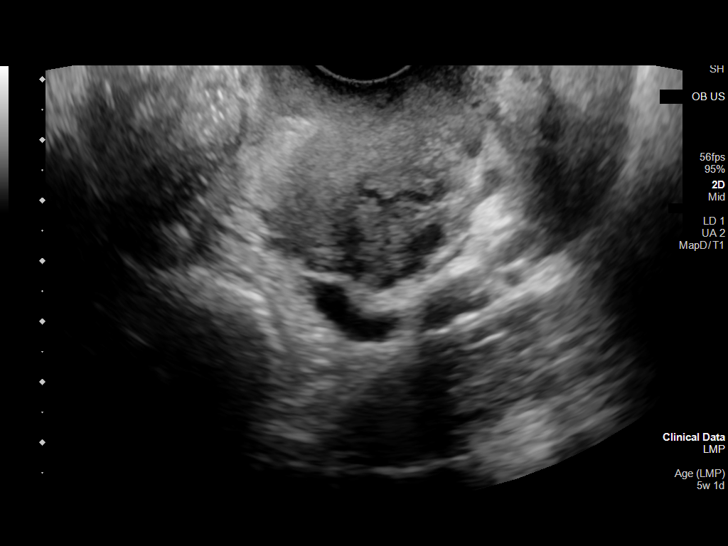
[im 44/70]
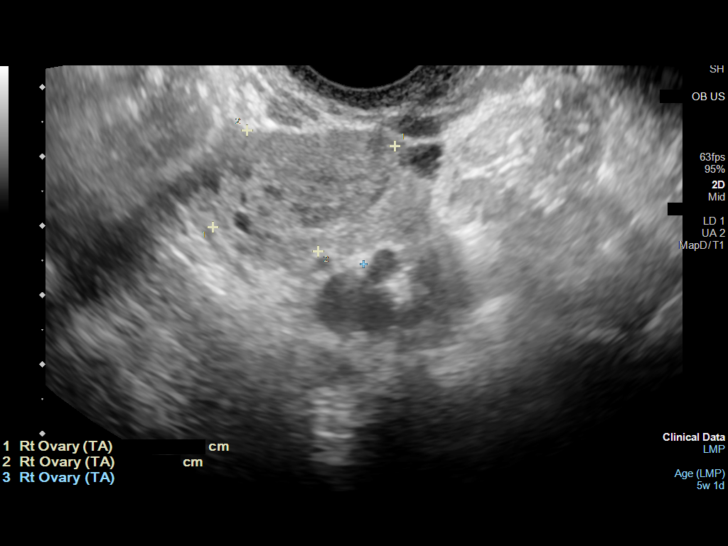
[im 49/70]
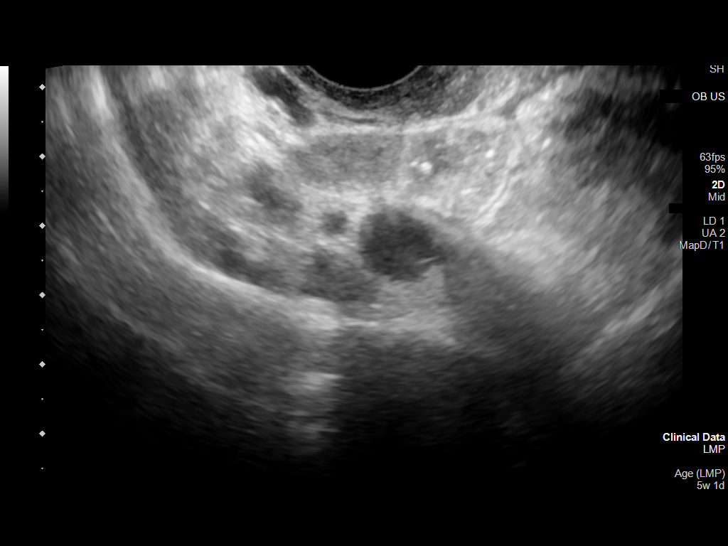
[im 54/70]
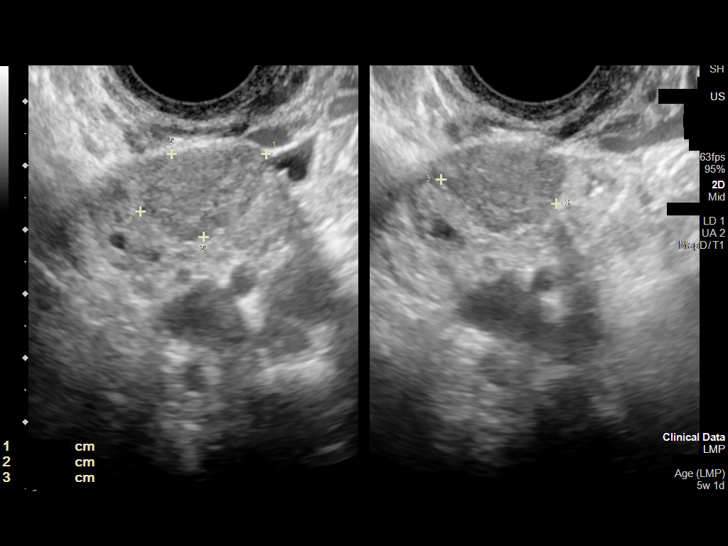
[im 59/70]
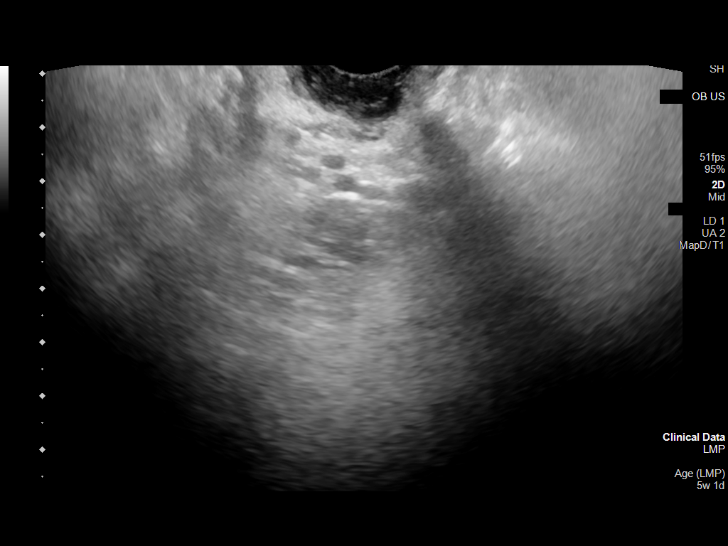
[im 64/70]
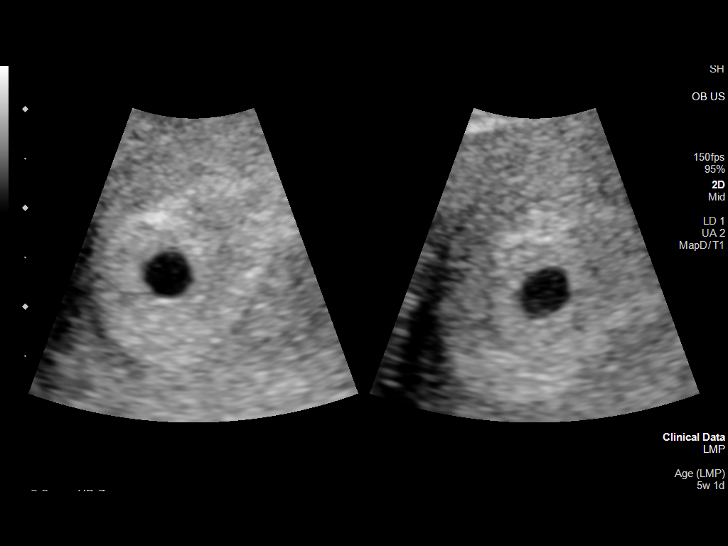
[im 70/70]
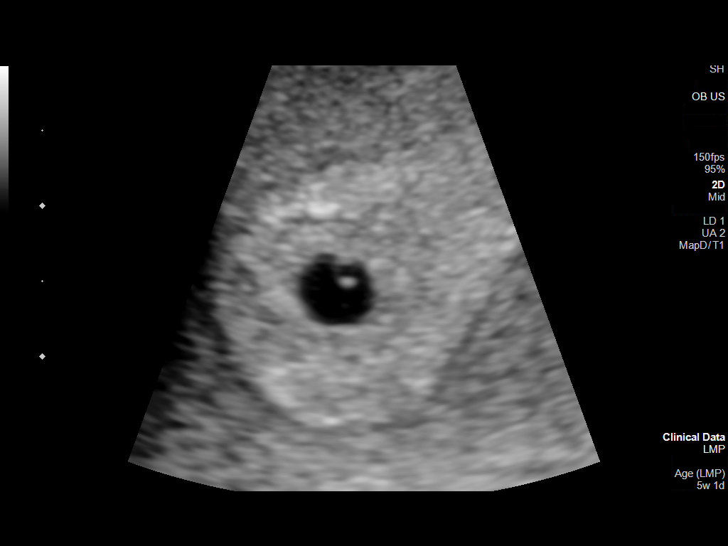

[14 of 28 positions shown; findings below may reference images not displayed]

FINDINGS: Intrauterine gestational sac: Single

Yolk sac:  Visualized.

Embryo:  Likely visualized

Cardiac Activity: Not visualized

CRL:  2 mm   5 w   5 d                  US EDC: 10/02/2021

Subchorionic hemorrhage:  None visualized.

Maternal uterus/adnexae: Right ovary measures 1.9 by 2.9 x 2 cm and
contains probable hemorrhagic corpus luteum. The left ovary is not
seen. Trace free fluid.
IMPRESSION: 1. Single intrauterine pregnancy with visible gestational sac yolk
sac and probable very tiny embryo. Cardiac activity could not be
documented but this could be related to small size of the embryo.
Recommend ultrasound follow-up in 7-14 days to confirm viability.
2. Trace free fluid.

## 2022-03-18 ENCOUNTER — Emergency Department (HOSPITAL_BASED_OUTPATIENT_CLINIC_OR_DEPARTMENT_OTHER): Payer: Medicaid Other

## 2022-03-18 ENCOUNTER — Other Ambulatory Visit: Payer: Self-pay

## 2022-03-18 ENCOUNTER — Emergency Department (HOSPITAL_BASED_OUTPATIENT_CLINIC_OR_DEPARTMENT_OTHER)
Admission: EM | Admit: 2022-03-18 | Discharge: 2022-03-18 | Discharge status: Against Medical Advice | Payer: Medicaid Other | Attending: Emergency Medicine | Admitting: Emergency Medicine

## 2022-03-18 DIAGNOSIS — R519 Headache, unspecified: Secondary | ICD-10-CM | POA: Diagnosis present

## 2022-03-18 DIAGNOSIS — Z1152 Encounter for screening for COVID-19: Secondary | ICD-10-CM | POA: Insufficient documentation

## 2022-03-18 DIAGNOSIS — G43811 Other migraine, intractable, with status migrainosus: Secondary | ICD-10-CM | POA: Diagnosis not present

## 2022-03-18 LAB — RESP PANEL BY RT-PCR (RSV, FLU A&B, COVID)  RVPGX2
Influenza A by PCR: NEGATIVE
Influenza B by PCR: NEGATIVE
Resp Syncytial Virus by PCR: NEGATIVE
SARS Coronavirus 2 by RT PCR: NEGATIVE

## 2022-03-18 LAB — PREGNANCY, URINE: Preg Test, Ur: NEGATIVE

## 2022-03-18 MED ORDER — METOCLOPRAMIDE HCL 5 MG/ML IJ SOLN
10.0000 mg | Freq: Once | INTRAMUSCULAR | Status: AC
  Administered 2022-03-18: 10 mg via INTRAVENOUS
  Filled 2022-03-18: qty 2

## 2022-03-18 MED ORDER — DIPHENHYDRAMINE HCL 50 MG/ML IJ SOLN
25.0000 mg | Freq: Once | INTRAMUSCULAR | Status: AC
  Administered 2022-03-18: 25 mg via INTRAVENOUS
  Filled 2022-03-18: qty 1

## 2022-03-18 MED ORDER — SODIUM CHLORIDE 0.9 % IV BOLUS
1000.0000 mL | Freq: Once | INTRAVENOUS | Status: AC
  Administered 2022-03-18: 1000 mL via INTRAVENOUS

## 2022-03-18 MED ORDER — DEXAMETHASONE SODIUM PHOSPHATE 10 MG/ML IJ SOLN
10.0000 mg | Freq: Once | INTRAMUSCULAR | Status: AC
  Administered 2022-03-18: 10 mg via INTRAVENOUS
  Filled 2022-03-18: qty 1

## 2022-03-18 NOTE — ED Notes (Signed)
Pt used call light to request that RN remove IV immediately and discontinue care. She says that she is ready to go and will look for CT results on MyChart. RN attempted to convince pt to wait for provider counseling but she refused and insisted on immediate departure. Signed AMA form and ambulated w/o difficulty to exit. She states that her headache has improved.

## 2022-03-18 NOTE — ED Triage Notes (Signed)
Pt reports ongoing headache for several days and reports intermittent dizziness with the headaches. Pt reports taking taking meds at home without relief.

## 2022-03-18 NOTE — ED Provider Notes (Addendum)
[facility] Provider Note   CSN: [id number] Arrival date & time: 03/18/22  1939     History  Chief Complaint  Patient presents with   Migraine    Rebecca [name] is a 22 y.o. female.  Patient states she has had a history of headaches in the past but never formally diagnosed as migraine.  Patient with 8 days of headache bilateral temporal into the back of the head.  Not associated with any visual changes or photophobia not associated with nausea or vomiting.  No fevers no stiff neck.  Patient states occasionally she has had some numbness to her upper extremities.  Patient states the pattern of headache is similar to what she has had in the past but has not had one for a while.  Patient states that she has tried Tylenol Motrin and Naprosyn without any relief.  Past medical history significant for migraines anxiety and obesity.       Home Medications Prior to Admission medications   Medication Sig Start Date End Date Taking? Authorizing Provider  acetaminophen (TYLENOL) 500 MG tablet Take 500 mg by mouth every 6 (six) hours as needed.    [provider]  cefadroxil (DURICEF) 500 MG capsule Take 1 capsule (500 mg total) by mouth 2 (two) times daily. 07/22/21   Truett Mainland, DO  cyclobenzaprine (FLEXERIL) 10 MG tablet Take 1 tablet (10 mg total) by mouth 3 (three) times daily as needed for muscle spasms. 06/27/21   Anyanwu, Sallyanne Havers, MD  famotidine (PEPCID) 20 MG tablet Take 1 tablet (20 mg total) by mouth daily. Pt has not started yet 07/22/21   Truett Mainland, DO  fluticasone [facility]) 50 MCG/ACT nasal spray Place 1 spray into both nostrils as needed for allergies. 08/16/18   [provider]  hydrOXYzine (ATARAX/VISTARIL) 25 MG tablet Take 1 tablet (25 mg total) by mouth every 6 (six) hours. 08/19/18   Henderly, Britni A, PA-C  Prenat-FeAsp-Meth-FA-DHA w/o A (PRENATE PIXIE) 10-0.6-0.4-200 MG CAPS Take 1 capsule by mouth daily.  02/01/21   [provider]  promethazine (PHENERGAN) 25 MG suppository Place 1 suppository (25 mg total) rectally every 6 (six) hours as needed for nausea. 03/31/21   Varney Biles, MD  promethazine (PHENERGAN) 25 MG tablet Take 1 tablet (25 mg total) by mouth every 6 (six) hours as needed for nausea or vomiting. 03/31/21   Varney Biles, MD      Allergies    Patient has no known allergies.    Review of Systems   Review of Systems  Constitutional:  Negative for chills and fever.  HENT:  Negative for ear pain and sore throat.   Eyes:  Negative for pain and visual disturbance.  Respiratory:  Negative for cough and shortness of breath.   Cardiovascular:  Negative for chest pain and palpitations.  Gastrointestinal:  Negative for abdominal pain and vomiting.  Genitourinary:  Negative for dysuria and hematuria.  Musculoskeletal:  Negative for arthralgias and back pain.  Skin:  Negative for color change and rash.  Neurological:  Positive for numbness and headaches. Negative for seizures and syncope.  All other systems reviewed and are negative.   Physical Exam Updated Vital Signs BP 121/68 (BP Location: Left Arm)   Pulse 84   Temp 97.6 F (36.4 C)   Resp 18   Ht 1.702 m ('5\' 7"'$ )   LMP 03/18/2022 (Exact Date)   SpO2 100%   BMI 34.93 kg/m  Physical Exam  Vitals and nursing note reviewed.  Constitutional:      General: She is not in acute distress.    Appearance: Normal appearance. She is well-developed. She is not ill-appearing or toxic-appearing.  HENT:     Head: Normocephalic and atraumatic.     Mouth/Throat:     Mouth: Mucous membranes are moist.  Eyes:     Extraocular Movements: Extraocular movements intact.     Conjunctiva/sclera: Conjunctivae normal.     Pupils: Pupils are equal, round, and reactive to light.  Cardiovascular:     Rate and Rhythm: Normal rate and regular rhythm.     Heart sounds: No murmur heard. Pulmonary:     Effort: Pulmonary effort is  normal. No respiratory distress.     Breath sounds: Normal breath sounds.  Abdominal:     Palpations: Abdomen is soft.     Tenderness: There is no abdominal tenderness.  Musculoskeletal:        General: No swelling.     Cervical back: Normal range of motion and neck supple. No rigidity.  Skin:    General: Skin is warm and dry.     Capillary Refill: Capillary refill takes less than 2 seconds.  Neurological:     General: No focal deficit present.     Mental Status: She is alert and oriented to person, place, and time.     Cranial Nerves: No cranial nerve deficit.     Sensory: No sensory deficit.     Motor: No weakness.  Psychiatric:        Mood and Affect: Mood normal.     ED Results / Procedures / Treatments   Labs (all labs ordered are listed, but only abnormal results are displayed) Labs Reviewed  RESP PANEL BY RT-PCR (RSV, FLU A&B, COVID)  RVPGX2  PREGNANCY, URINE    EKG None  Radiology No results found.  Procedures Procedures    Medications Ordered in ED Medications  sodium chloride 0.9 % bolus 1,000 mL (has no administration in time range)  dexamethasone (DECADRON) injection 10 mg (has no administration in time range)  metoCLOPramide (REGLAN) injection 10 mg (has no administration in time range)  diphenhydrAMINE (BENADRYL) injection 25 mg (has no administration in time range)    ED Course/ Medical Decision Making/ A&P                             Medical Decision Making Amount and/or Complexity of Data Reviewed Labs: ordered. Radiology: ordered.  Risk Prescription drug management.   Will treat with migraine cocktail.  Will get CT head.  The pregnancy test pending respiratory panel negative for flu COVID and RSV.  Will give IV fluids along with Decadron Reglan and Benadryl.  Patient nontoxic no acute distress.  Vital signs significant for temp of 97.6 heart rate of 84 respirations 18 blood pressure 121/68 oxygen saturation is 100% on room  air.  Pregnancy test negative.  CT head no acute intracranial abnormality.  Patient left AMA before receiving migraine cocktail.  Patient told nurse she was not staying.   Final Clinical Impression(s) / ED Diagnoses Final diagnoses:  Other migraine with status migrainosus, intractable    Rx / DC Orders ED Discharge Orders     None         Fredia Sorrow, MD 03/18/22 2203    Fredia Sorrow, MD 03/18/22 2239

## 2022-03-18 NOTE — ED Notes (Signed)
Pt ambulatory to restroom without assistance.

## 2022-04-12 IMAGING — US US OB LIMITED
1 series · 14 of 28 positions shown · non-contrast
Comparison: 02/04/2021

CLINICAL DATA: Vaginal spotting, 4 months pregnant

EXAM:
LIMITED OBSTETRIC ULTRASOUND

[Series 1: us ob limited · 36 acquisitions, 14 frames shown]
[im 2/36]
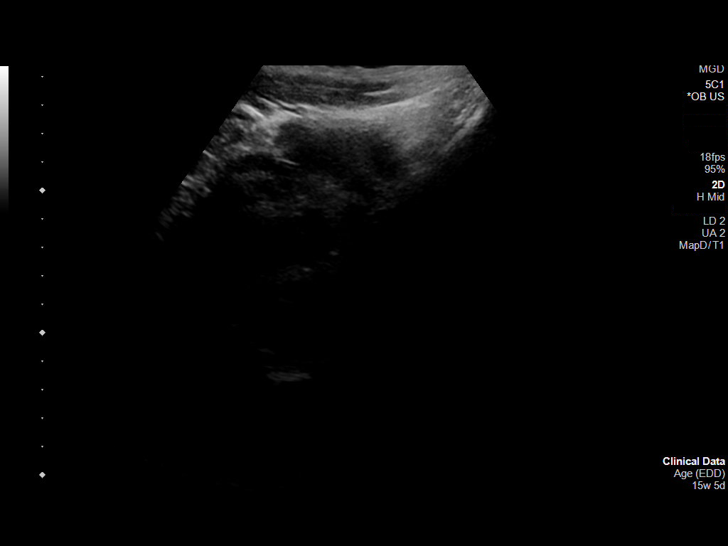
[im 4/36]
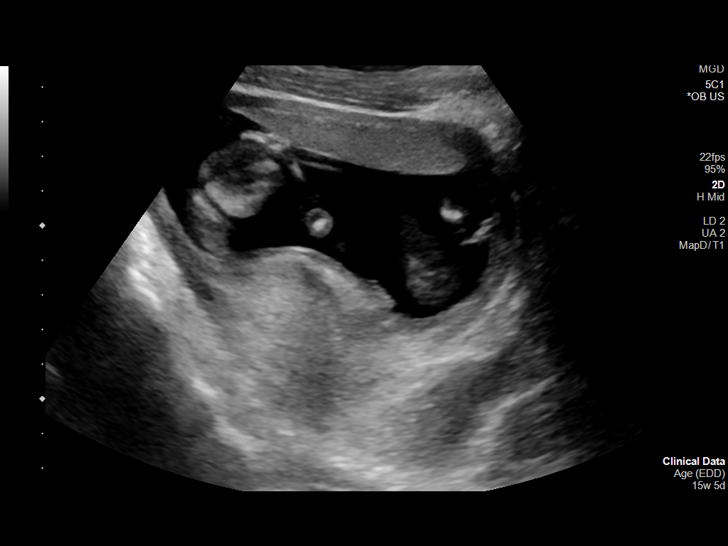
[im 7/36]
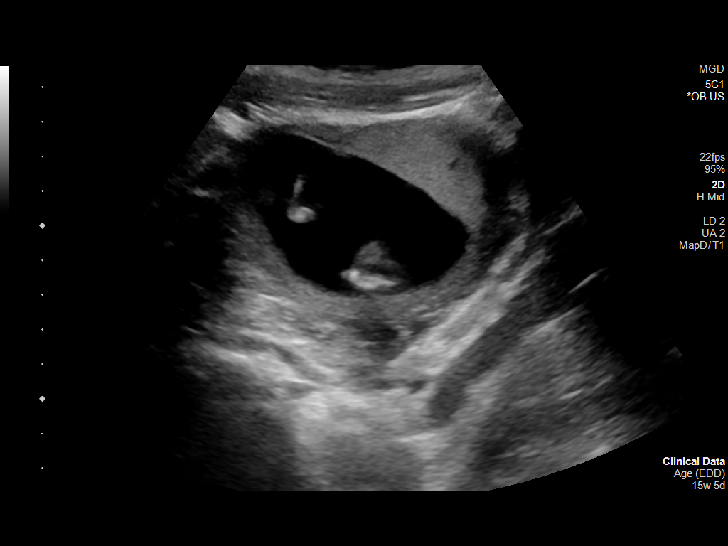
[im 10/36]
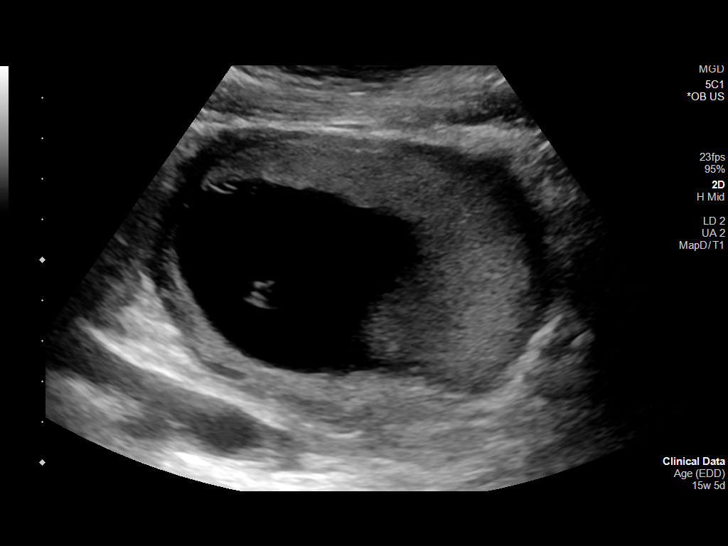
[im 12/36]
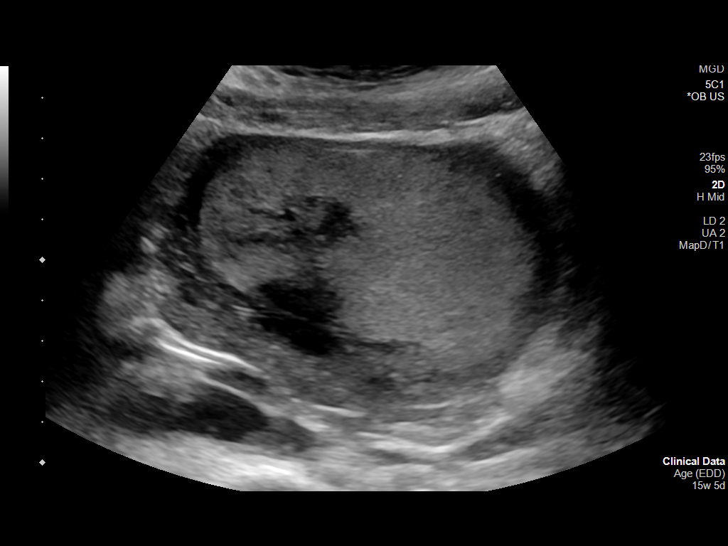
[im 15/36]
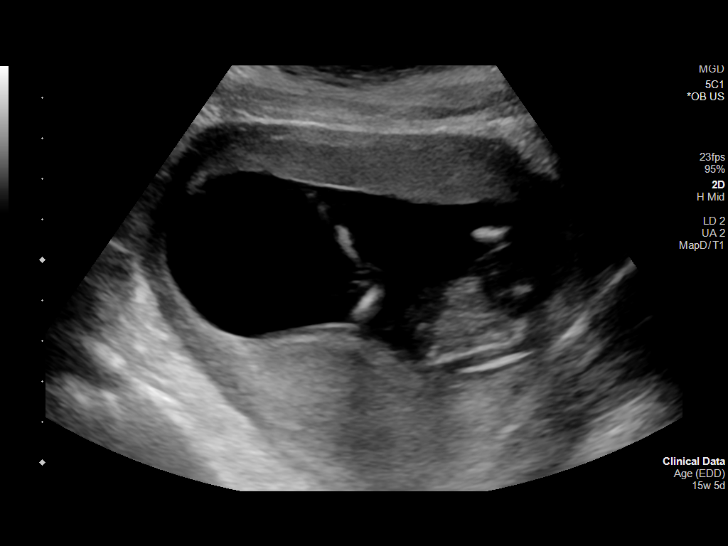
[im 17/36]
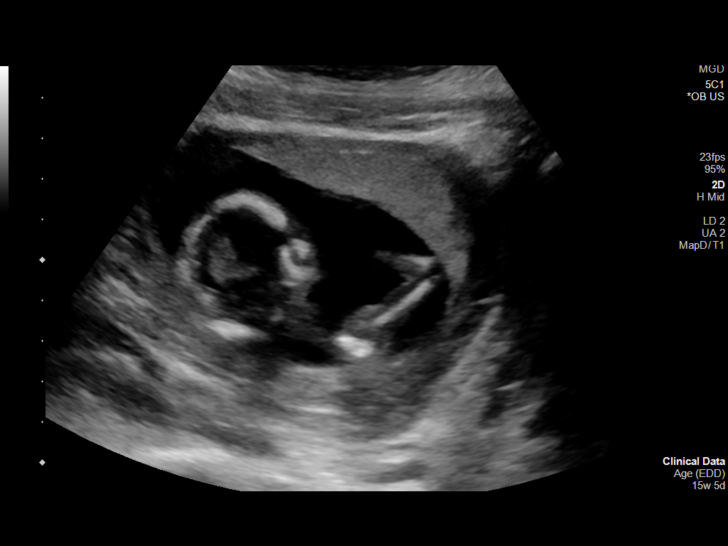
[im 20/36]
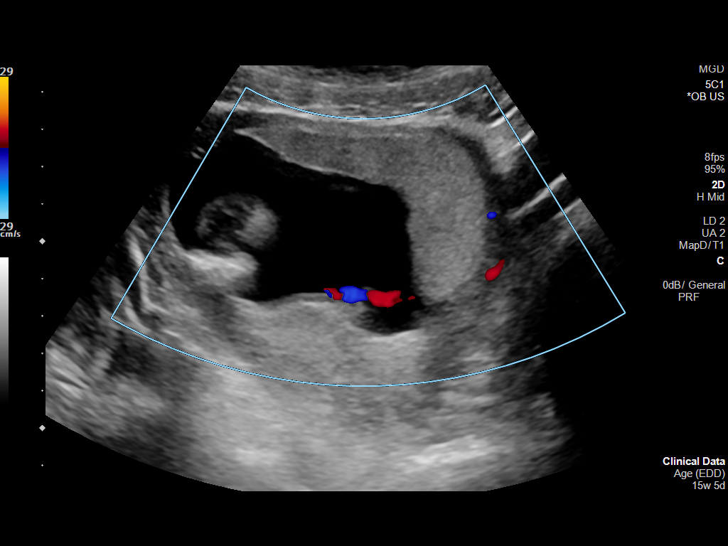
[im 23/36]
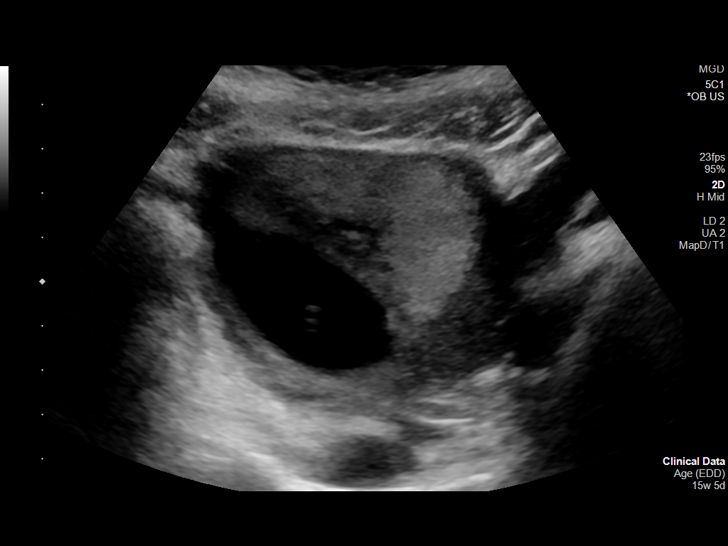
[im 25/36]
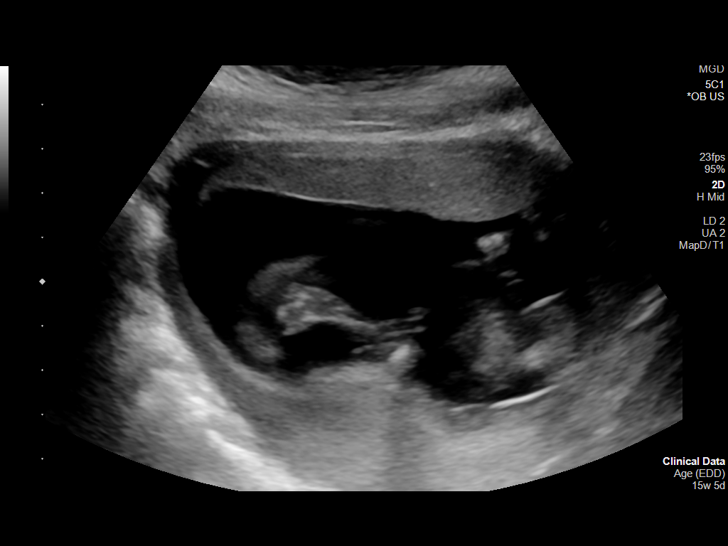
[im 28/36]
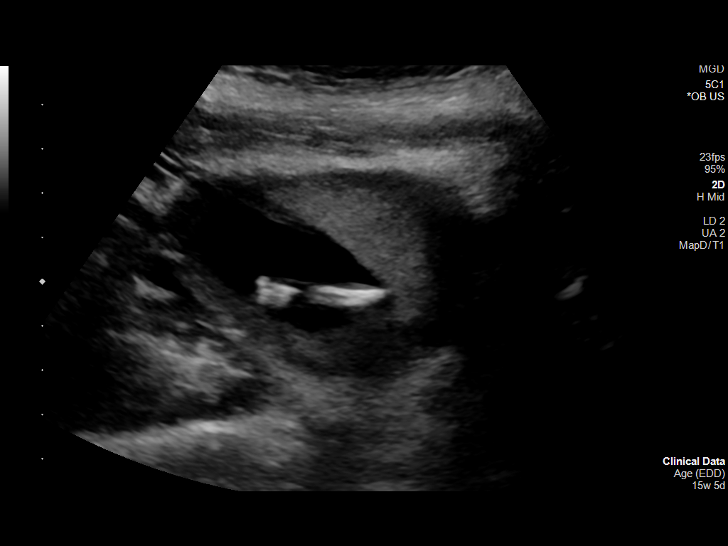
[im 30/36]
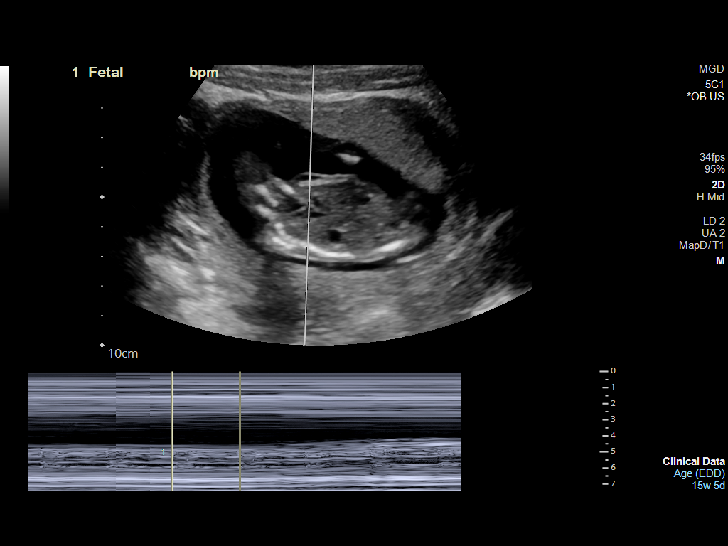
[im 33/36]
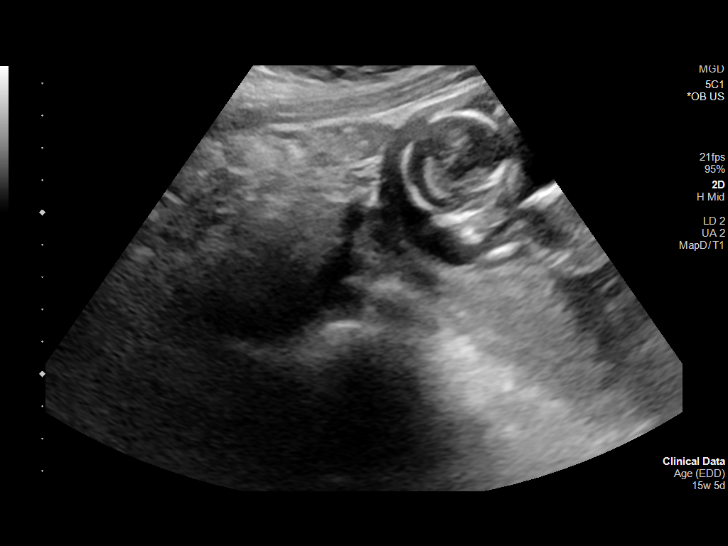
[im 36/36]
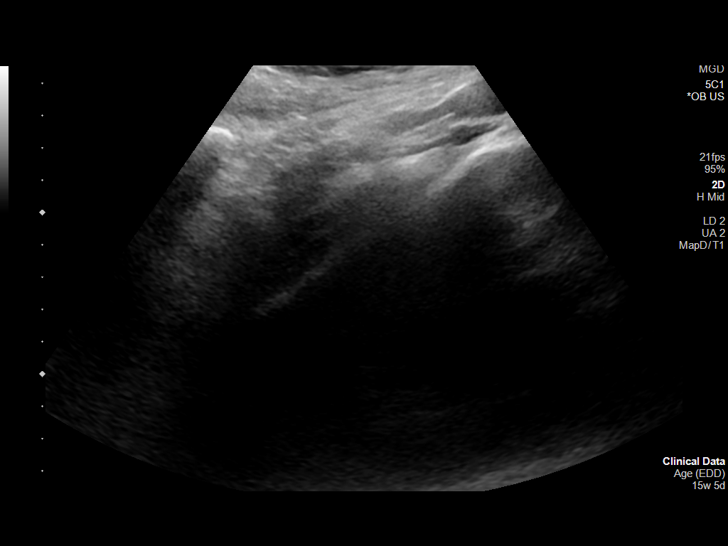

[14 of 28 positions shown; findings below may reference images not displayed]

FINDINGS: Number of Fetuses: 1

Heart Rate:  152 bpm

Movement: Yes

Presentation: Breech, likely variable in early pregnancy

Placental Location: Anterior

Previa: No

Amniotic Fluid (Subjective):  Within normal limits.

MATERNAL FINDINGS:

Cervix:  Appears closed.

Uterus/Adnexae: No abnormality visualized.
IMPRESSION: Single live intrauterine pregnancy. Fetal heart rate 152 beats per
minute.

This exam is performed on an emergent basis and does not
comprehensively evaluate fetal size, dating, or anatomy; follow-up
complete OB US should be considered if further fetal assessment is
warranted.

## 2022-04-12 IMAGING — US US RENAL
1 series · 14 of 25 positions shown · non-contrast
Comparison: September 26, 2020.

CLINICAL DATA: Left flank pain.

EXAM:
RENAL / URINARY TRACT ULTRASOUND COMPLETE

[Series 1: us renal · 45 acquisitions, 14 frames shown]
[im 1/45]
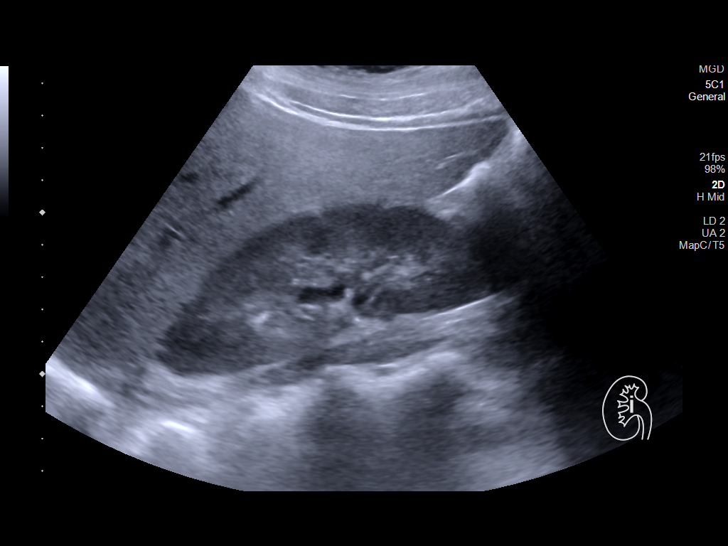
[im 4/45]
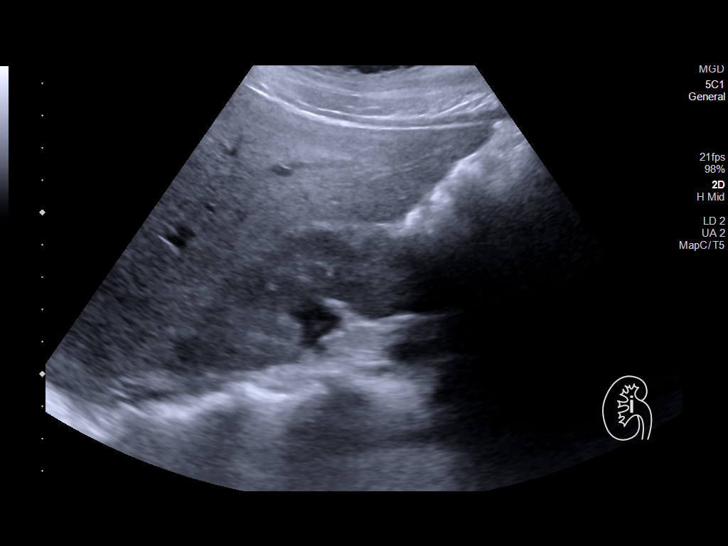
[im 8/45]
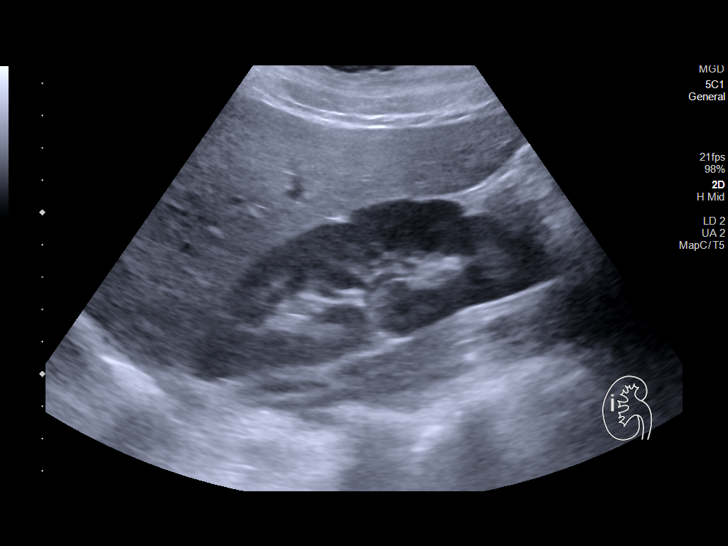
[im 12/45]
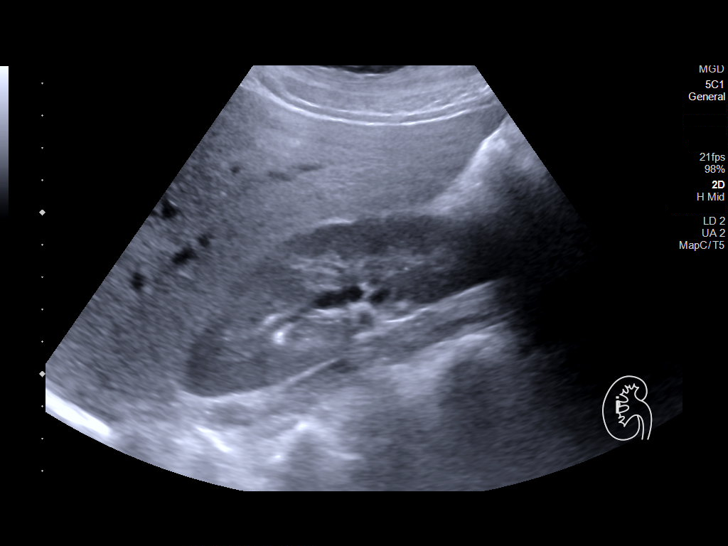
[im 15/45]
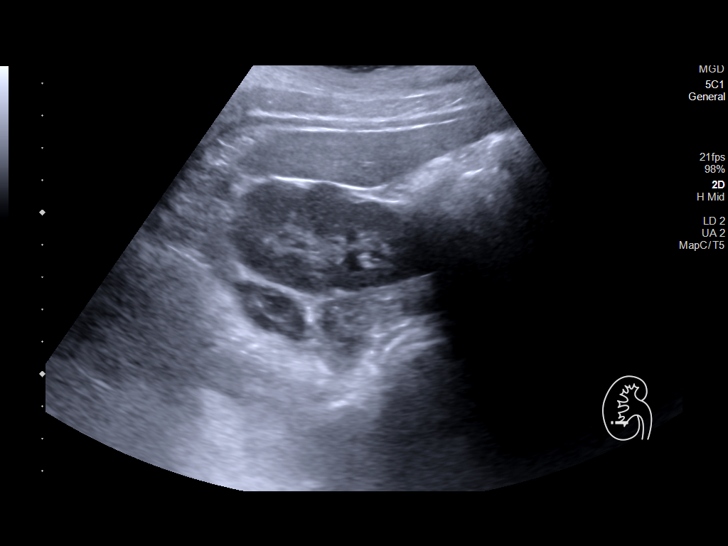
[im 17/45]
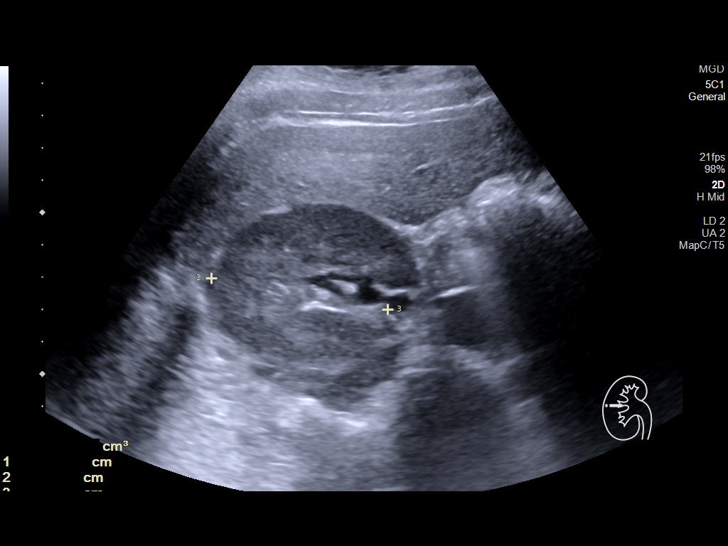
[im 21/45]
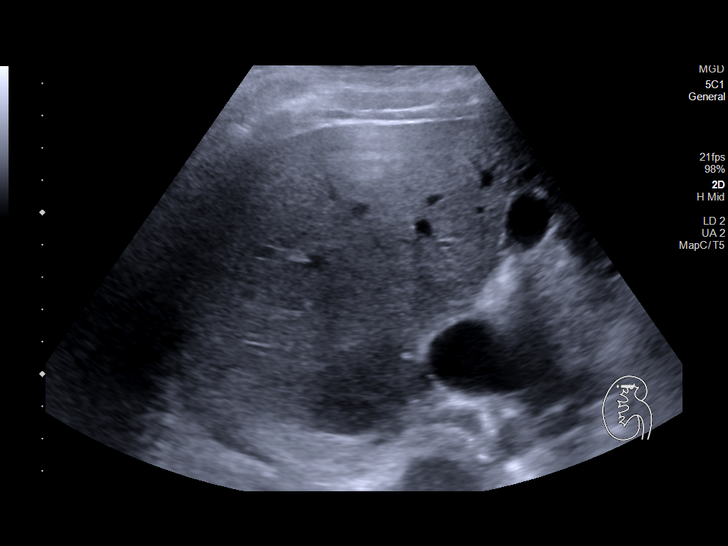
[im 24/45]
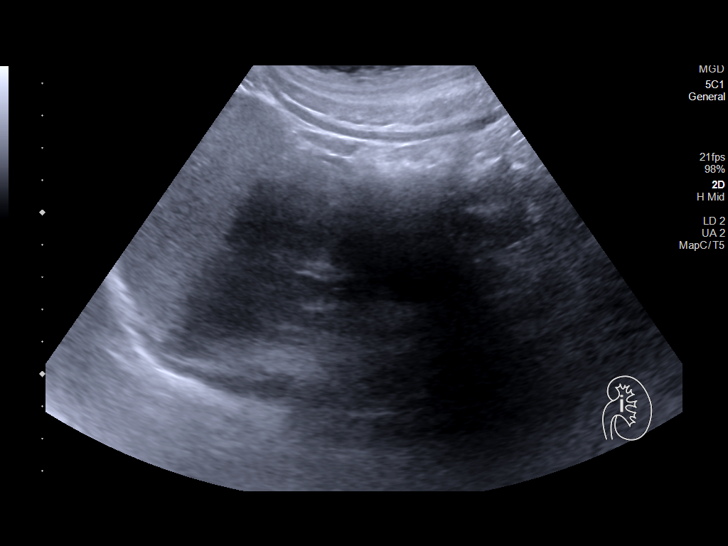
[im 28/45]
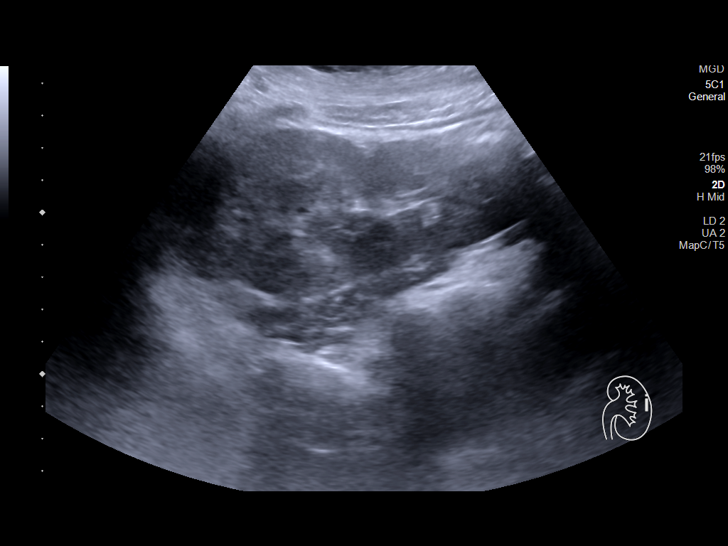
[im 30/45]
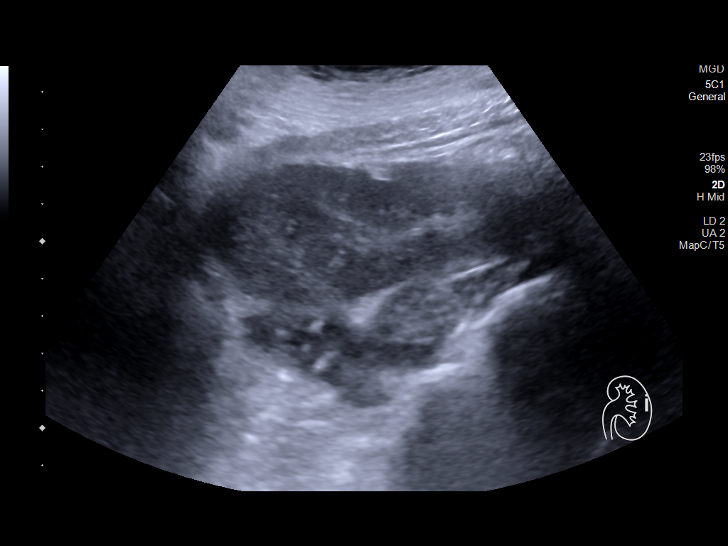
[im 34/45]
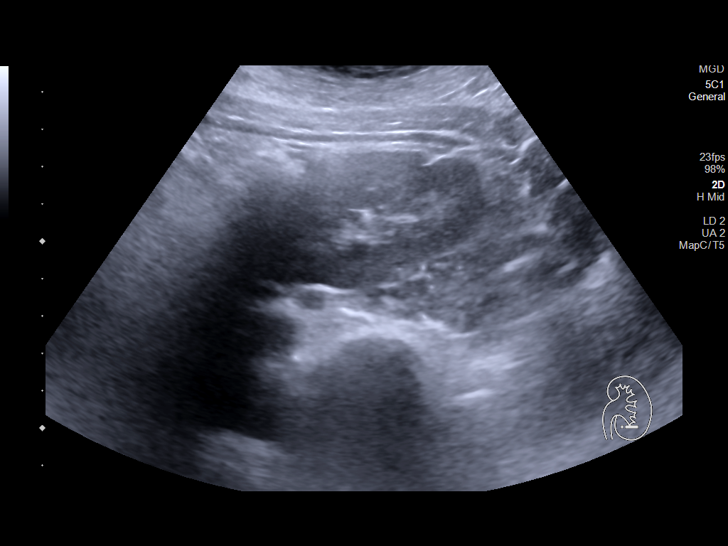
[im 37/45]
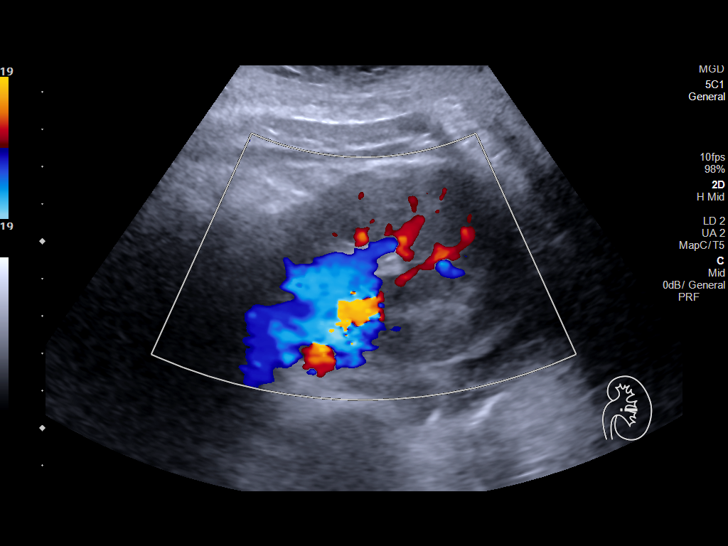
[im 41/45]
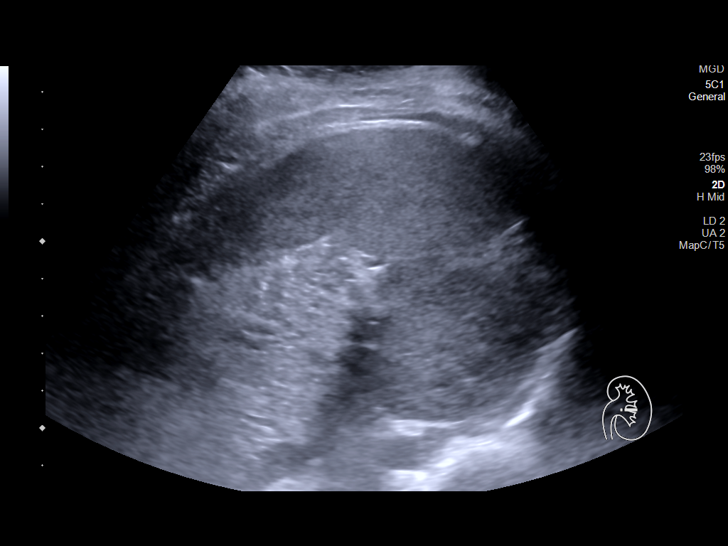
[im 45/45]
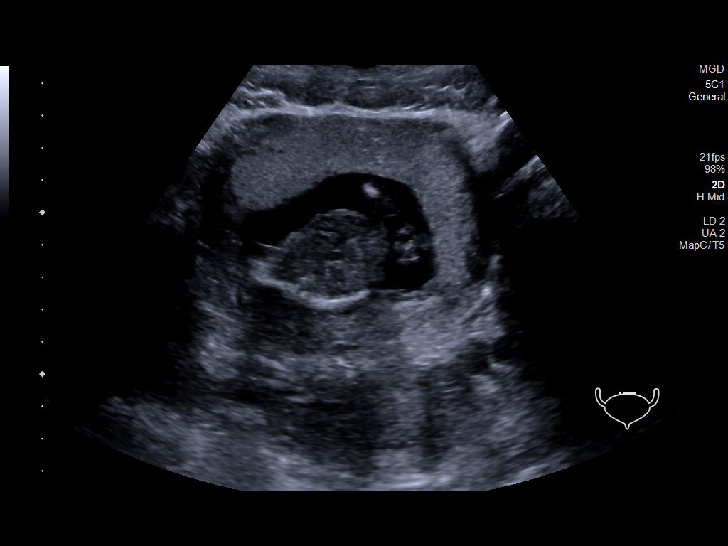

[14 of 25 positions shown; findings below may reference images not displayed]

FINDINGS: Right Kidney:

Renal measurements: 12.2 x 4.4 x 5.4 cm = volume: 157 mL.
Echogenicity within normal limits. No mass or hydronephrosis
visualized.

Left Kidney:

Renal measurements: 12.4 x 5 1 x 4.8 cm = volume: 160 mL.
Echogenicity within normal limits. No mass or hydronephrosis
visualized.

Bladder:

Not well visualized as bladder is decompressed as patient just
voided.

Other:

None.
IMPRESSION: No significant renal abnormality seen.

## 2022-04-24 ENCOUNTER — Emergency Department (HOSPITAL_COMMUNITY)
Admission: EM | Admit: 2022-04-24 | Discharge: 2022-04-24 | Disposition: A | Discharge status: Home / Self Care | Payer: Medicaid Other | Attending: Emergency Medicine | Admitting: Emergency Medicine

## 2022-04-24 ENCOUNTER — Encounter (HOSPITAL_COMMUNITY): Payer: Self-pay

## 2022-04-24 DIAGNOSIS — Z1152 Encounter for screening for COVID-19: Secondary | ICD-10-CM | POA: Insufficient documentation

## 2022-04-24 DIAGNOSIS — D649 Anemia, unspecified: Secondary | ICD-10-CM | POA: Insufficient documentation

## 2022-04-24 DIAGNOSIS — R42 Dizziness and giddiness: Secondary | ICD-10-CM | POA: Diagnosis not present

## 2022-04-24 DIAGNOSIS — R059 Cough, unspecified: Secondary | ICD-10-CM | POA: Diagnosis present

## 2022-04-24 DIAGNOSIS — B349 Viral infection, unspecified: Secondary | ICD-10-CM

## 2022-04-24 LAB — BASIC METABOLIC PANEL
Anion gap: 8 (ref 5–15)
BUN: 8 mg/dL (ref 6–20)
CO2: 26 mmol/L (ref 22–32)
Calcium: 8.7 mg/dL — ABNORMAL LOW (ref 8.9–10.3)
Chloride: 103 mmol/L (ref 98–111)
Creatinine, Ser: 0.71 mg/dL (ref 0.44–1.00)
GFR, Estimated: >60 mL/min (ref >60)
Glucose, Bld: 111 mg/dL — ABNORMAL HIGH (ref 70–99)
Potassium: 3.7 mmol/L (ref 3.5–5.1)
Sodium: 137 mmol/L (ref 135–145)

## 2022-04-24 LAB — URINALYSIS, ROUTINE W REFLEX MICROSCOPIC
Bilirubin Urine: NEGATIVE
Glucose, UA: NEGATIVE mg/dL
Hgb urine dipstick: NEGATIVE
Ketones, ur: NEGATIVE mg/dL
Nitrite: NEGATIVE
Protein, ur: NEGATIVE mg/dL
Specific Gravity, Urine: 1.017 (ref 1.005–1.030)
pH: 6 (ref 5.0–8.0)

## 2022-04-24 LAB — RESP PANEL BY RT-PCR (RSV, FLU A&B, COVID)  RVPGX2
Influenza A by PCR: NEGATIVE
Influenza B by PCR: NEGATIVE
Resp Syncytial Virus by PCR: NEGATIVE
SARS Coronavirus 2 by RT PCR: NEGATIVE

## 2022-04-24 LAB — I-STAT BETA HCG BLOOD, ED (MC, WL, AP ONLY): I-stat hCG, quantitative: <5 m[IU]/mL (ref <5)

## 2022-04-24 LAB — CBC
HCT: 29.4 % — ABNORMAL LOW (ref 36.0–46.0)
Hemoglobin: 8.9 g/dL — ABNORMAL LOW (ref 12.0–15.0)
MCH: 23.3 pg — ABNORMAL LOW (ref 26.0–34.0)
MCHC: 30.3 g/dL (ref 30.0–36.0)
MCV: 77 fL — ABNORMAL LOW (ref 80.0–100.0)
Platelets: 413 10*3/uL — ABNORMAL HIGH (ref 150–400)
RBC: 3.82 MIL/uL — ABNORMAL LOW (ref 3.87–5.11)
RDW: 16 % — ABNORMAL HIGH (ref 11.5–15.5)
WBC: 3.8 10*3/uL — ABNORMAL LOW (ref 4.0–10.5)
nRBC: 0 % (ref 0.0–0.2)

## 2022-04-24 LAB — TYPE AND SCREEN
ABO/RH(D): A POS
Antibody Screen: NEGATIVE

## 2022-04-24 NOTE — ED Provider Notes (Signed)
[place] [facility] AT [facility] Provider Note   CSN: [id number] Arrival date & time: 04/24/22  1419     History  No chief complaint on file.   Rebecca [name] is a 22 y.o. female.  22 year old female presents with URI symptoms times several days.  States she has had nonproductive cough.  Notes that she feels that she has a tickle in her throat and then she has to cough.  Denies any actual sore throat.  No trouble swallowing.  No reported fever or chills.  No urinary symptoms.  States at times he feels lightheaded and dizzy.  Does have a history of anemia hemoglobin runs around 9.  Here at the encouragement of her family       Home Medications Prior to Admission medications   Medication Sig Start Date End Date Taking? Authorizing Provider  acetaminophen (TYLENOL) 500 MG tablet Take 500 mg by mouth every 6 (six) hours as needed.    [provider]  cefadroxil (DURICEF) 500 MG capsule Take 1 capsule (500 mg total) by mouth 2 (two) times daily. 07/22/21   Levie Heritage, DO  cyclobenzaprine (FLEXERIL) 10 MG tablet Take 1 tablet (10 mg total) by mouth 3 (three) times daily as needed for muscle spasms. 06/27/21   Anyanwu, Jethro Bastos, MD  famotidine (PEPCID) 20 MG tablet Take 1 tablet (20 mg total) by mouth daily. Pt has not started yet 07/22/21   Levie Heritage, DO  fluticasone [facility]) 50 MCG/ACT nasal spray Place 1 spray into both nostrils as needed for allergies. 08/16/18   [provider]  hydrOXYzine (ATARAX/VISTARIL) 25 MG tablet Take 1 tablet (25 mg total) by mouth every 6 (six) hours. 08/19/18   Henderly, Britni A, PA-C  Prenat-FeAsp-Meth-FA-DHA w/o A (PRENATE PIXIE) 10-0.6-0.4-200 MG CAPS Take 1 capsule by mouth daily. 02/01/21   [provider]  promethazine (PHENERGAN) 25 MG suppository Place 1 suppository (25 mg total) rectally every 6 (six) hours as needed for nausea. 03/31/21   Derwood Kaplan, MD  promethazine (PHENERGAN) 25 MG tablet  Take 1 tablet (25 mg total) by mouth every 6 (six) hours as needed for nausea or vomiting. 03/31/21   Derwood Kaplan, MD      Allergies    Patient has no known allergies.    Review of Systems   Review of Systems  All other systems reviewed and are negative.   Physical Exam Updated Vital Signs BP (!) 119/97 (BP Location: Right Arm)   Pulse 78   Temp 98.3 F (36.8 C)   Resp 19   SpO2 100%  Physical Exam Vitals and nursing note reviewed.  Constitutional:      General: She is not in acute distress.    Appearance: Normal appearance. She is well-developed. She is not toxic-appearing.  HENT:     Head: Normocephalic and atraumatic.  Eyes:     General: Lids are normal.     Conjunctiva/sclera: Conjunctivae normal.     Pupils: Pupils are equal, round, and reactive to light.  Neck:     Thyroid: No thyroid mass.     Trachea: No tracheal deviation.  Cardiovascular:     Rate and Rhythm: Normal rate and regular rhythm.     Heart sounds: Normal heart sounds. No murmur heard.    No gallop.  Pulmonary:     Effort: Pulmonary effort is normal. No respiratory distress.     Breath sounds: Normal breath sounds. No stridor. No decreased breath sounds, wheezing,  rhonchi or rales.  Abdominal:     General: There is no distension.     Palpations: Abdomen is soft.     Tenderness: There is no abdominal tenderness. There is no rebound.  Musculoskeletal:        General: No tenderness. Normal range of motion.     Cervical back: Normal range of motion and neck supple.  Skin:    General: Skin is warm and dry.     Findings: No abrasion or rash.  Neurological:     Mental Status: She is alert and oriented to person, place, and time. Mental status is at baseline.     GCS: GCS eye subscore is 4. GCS verbal subscore is 5. GCS motor subscore is 6.     Cranial Nerves: No cranial nerve deficit.     Sensory: No sensory deficit.     Motor: Motor function is intact.  Psychiatric:        Attention and  Perception: Attention normal.        Speech: Speech normal.        Behavior: Behavior normal.     ED Results / Procedures / Treatments   Labs (all labs ordered are listed, but only abnormal results are displayed) Labs Reviewed  BASIC METABOLIC PANEL - Abnormal; Notable for the following components:      Result Value   Glucose, Bld 111 (*)    Calcium 8.7 (*)    All other components within normal limits  CBC - Abnormal; Notable for the following components:   WBC 3.8 (*)    RBC 3.82 (*)    Hemoglobin 8.9 (*)    HCT 29.4 (*)    MCV 77.0 (*)    MCH 23.3 (*)    RDW 16.0 (*)    Platelets 413 (*)    All other components within normal limits  URINALYSIS, ROUTINE W REFLEX MICROSCOPIC - Abnormal; Notable for the following components:   APPearance HAZY (*)    Leukocytes,Ua TRACE (*)    Bacteria, UA RARE (*)    All other components within normal limits  RESP PANEL BY RT-PCR (RSV, FLU A&B, COVID)  RVPGX2  I-STAT BETA HCG BLOOD, ED ([facility], [facility], [facility] ONLY)  CBG MONITORING, ED  TYPE AND SCREEN    EKG EKG Interpretation  Date/Time:  Thursday April 24 2022 16:23:28 EDT Ventricular Rate:  81 PR Interval:  170 QRS Duration: 90 QT Interval:  358 QTC Calculation: 415 R Axis:   52 Text Interpretation: Normal sinus rhythm Normal ECG When compared with ECG of 22-Jul-2021 11:52, PREVIOUS ECG IS PRESENT Confirmed by Lorre Nick ([id number]) on 04/24/2022 9:24:49 PM  Radiology No results found.  Procedures Procedures    Medications Ordered in ED Medications - No data to display  ED Course/ Medical Decision Making/ A&P                             Medical Decision Making  Patient is EKG per interpretation shows normal sinus rhythm.  No signs of acute ischemic changes.  Patient's hemoglobin here is 8.9 which is around her baseline.  COVID and flu test negative.  Patient likely viral illness and we discharged home        Final Clinical Impression(s) / ED Diagnoses Final diagnoses:   None    Rx / DC Orders ED Discharge Orders     None         Lorre Nick, MD  04/24/22 2125  

## 2022-04-24 NOTE — ED Notes (Addendum)
Patient verbalizes understanding of discharge instructions. Opportunity for questioning and answers were provided. Armband removed by staff, pt discharged from ED. Pt taken to ED waiting room via wheel chair.  

## 2022-04-24 NOTE — ED Provider Triage Note (Signed)
Emergency Medicine Provider Triage Evaluation Note  Shalea Monares , a 22 y.o. female  was evaluated in triage.  Pt complains of weak. Report lightheadedness, dizzy, weak, cp, blurry vision and feeling unsteady with fever for the past 3-4 days.  Hx of anemia  Review of Systems  Positive: As above Negative: As above  Physical Exam  BP (!) 119/101   Pulse 81   Temp 98.8 F (37.1 C) (Oral)   Resp 16   SpO2 98%  Gen:   Awake, no distress   Resp:  Normal effort  MSK:   Moves extremities without difficulty  Other:    Medical Decision Making  Medically screening exam initiated at 3:50 PM.  Appropriate orders placed.  Diedra Debaca was informed that the remainder of the evaluation will be completed by another provider, this initial triage assessment does not replace that evaluation, and the importance of remaining in the ED until their evaluation is complete.     Fayrene Helper, PA-C 04/24/22 1551

## 2022-04-24 NOTE — ED Triage Notes (Signed)
Pt states past 3-4 days generalized body ache, dizziness, intermittent cp, blurry vision, and dry throat; hx anemia; denies sick contacts; endorses increased fall and unsteadiness at home; endorses fever last night; denies blurry vision at present, worse when going to sitting to standing

## 2022-09-15 ENCOUNTER — Encounter (HOSPITAL_COMMUNITY): Payer: Self-pay | Admitting: Obstetrics and Gynecology

## 2022-09-15 ENCOUNTER — Inpatient Hospital Stay (HOSPITAL_COMMUNITY): Payer: Medicaid Other

## 2022-09-15 ENCOUNTER — Inpatient Hospital Stay (HOSPITAL_COMMUNITY)
Admission: AD | Admit: 2022-09-15 | Discharge: 2022-09-16 | Disposition: A | Discharge status: Home / Self Care | Payer: Medicaid Other | Attending: Obstetrics and Gynecology | Admitting: Obstetrics and Gynecology

## 2022-09-15 DIAGNOSIS — O039 Complete or unspecified spontaneous abortion without complication: Secondary | ICD-10-CM | POA: Diagnosis not present

## 2022-09-15 DIAGNOSIS — R109 Unspecified abdominal pain: Secondary | ICD-10-CM | POA: Diagnosis present

## 2022-09-15 DIAGNOSIS — Z3A10 10 weeks gestation of pregnancy: Secondary | ICD-10-CM | POA: Diagnosis not present

## 2022-09-15 DIAGNOSIS — O200 Threatened abortion: Secondary | ICD-10-CM | POA: Diagnosis present

## 2022-09-15 LAB — URINALYSIS, ROUTINE W REFLEX MICROSCOPIC
Bacteria, UA: NONE SEEN
Bilirubin Urine: NEGATIVE
Glucose, UA: NEGATIVE mg/dL
Ketones, ur: 5 mg/dL — AB
Leukocytes,Ua: NEGATIVE
Nitrite: NEGATIVE
Protein, ur: 30 mg/dL — AB
RBC / HPF: >50 RBC/hpf (ref 0–5)
Specific Gravity, Urine: 1.026 (ref 1.005–1.030)
pH: 5 (ref 5.0–8.0)

## 2022-09-15 LAB — COMPREHENSIVE METABOLIC PANEL
ALT: 10 U/L (ref 0–44)
AST: 13 U/L — ABNORMAL LOW (ref 15–41)
Albumin: 3.5 g/dL (ref 3.5–5.0)
Alkaline Phosphatase: 49 U/L (ref 38–126)
Anion gap: 12 (ref 5–15)
BUN: 8 mg/dL (ref 6–20)
CO2: 24 mmol/L (ref 22–32)
Calcium: 8.9 mg/dL (ref 8.9–10.3)
Chloride: 102 mmol/L (ref 98–111)
Creatinine, Ser: 0.62 mg/dL (ref 0.44–1.00)
GFR, Estimated: >60 mL/min (ref >60)
Glucose, Bld: 103 mg/dL — ABNORMAL HIGH (ref 70–99)
Potassium: 3.3 mmol/L — ABNORMAL LOW (ref 3.5–5.1)
Sodium: 138 mmol/L (ref 135–145)
Total Bilirubin: 0.7 mg/dL (ref 0.3–1.2)
Total Protein: 7.1 g/dL (ref 6.5–8.1)

## 2022-09-15 LAB — CBC
HCT: 30.9 % — ABNORMAL LOW (ref 36.0–46.0)
Hemoglobin: 9.9 g/dL — ABNORMAL LOW (ref 12.0–15.0)
MCH: 26.7 pg (ref 26.0–34.0)
MCHC: 32 g/dL (ref 30.0–36.0)
MCV: 83.3 fL (ref 80.0–100.0)
Platelets: 327 10*3/uL (ref 150–400)
RBC: 3.71 MIL/uL — ABNORMAL LOW (ref 3.87–5.11)
RDW: 16.8 % — ABNORMAL HIGH (ref 11.5–15.5)
WBC: 5.7 10*3/uL (ref 4.0–10.5)
nRBC: 0 % (ref 0.0–0.2)

## 2022-09-15 LAB — HCG, QUANTITATIVE, PREGNANCY: hCG, Beta Chain, Quant, S: 1555 m[IU]/mL — ABNORMAL HIGH (ref <5)

## 2022-09-15 NOTE — MAU Provider Note (Signed)
Chief Complaint: Vaginal Bleeding and Abdominal Pain   Event Date/Time   First Provider Initiated Contact with Patient 09/15/22 2227        SUBJECTIVE HPI: Rebecca [name] is a 22 y.o. G2P0001 at [redacted]w[redacted]d by LMP who presents to maternity admissions reporting bleeding and concern for ectopic pregnancy (her mother thinks she has this). She was seen twice at [facility] (OB is [facility] in [place]) on Saturday and they saw an elongated sac (6 wk size) in lower uterus (she says they told her the baby was viable, though note states they told her it looked probably not viable).. She denies h/a, dizziness, n/v, or fever/chills.    Vaginal Bleeding The patient's primary symptoms include pelvic pain and vaginal bleeding. This is a recurrent problem. Associated symptoms include abdominal pain. The vaginal discharge was bloody. The vaginal bleeding is lighter than menses. She has not been passing clots. She has not been passing tissue.  Abdominal Pain The problem occurs intermittently. The quality of the pain is described as cramping. Nothing relieves the symptoms. Past treatments include nothing.   RN Note: Pt says she was at [facility] in [place] on Sat - in ER- was D/C - Then went back at 4 am- had VB- thought was SAB- did U/S - told to see OB Dr. At 4 am- was numb in lower abd - went back to hospital- gave meds . Then today - went to work- was dizzy - cold , lower abd cramps.  Now in Triage - pad- mod amt dark red . Cramps - 5/10 (Mom told pt to ask if preg is in her tubes.)   Past Medical History:  Diagnosis Date   Anemia    Anxiety    Depression    Migraine    Obesity    Past Surgical History:  Procedure Laterality Date   CESAREAN SECTION     NO PAST SURGERIES     Social History   Socioeconomic History   Marital status: Single    Spouse name: Not on file   Number of children: Not on file   Years of education: Not on file   Highest education level: Not on file  Occupational History   Not on file  Tobacco  Use   Smoking status: Never   Smokeless tobacco: Never  Vaping Use   Vaping status: Former  Substance and Sexual Activity   Alcohol use: No   Drug use: Not Currently    Types: Marijuana   Sexual activity: Not on file  Other Topics Concern   Not on file  Social History Narrative   Not on file   Social Determinants of Health   Financial Resource Strain: High Risk (09/12/2021)   [facility] prior to 03/15/2022[facility] visits prior to 03/15/2022.   Overall Financial Resource Strain (CARDIA)    Difficulty of Paying Living Expenses: Hard  Food Insecurity: Food Insecurity Present (10/04/2021)   [facility] from [facility], [facility] prior to 03/15/2022., [facility], [facility] visits prior to 03/15/2022.   Hunger Vital Sign    Worried About Running Out of Food in the Last Year: Sometimes true    Ran Out of Food in the Last Year: Sometimes true  Transportation Needs: No Transportation Needs (10/04/2021)   [facility], [facility] visits prior to 03/15/2022., [facility], [facility]  visits prior to 03/15/2022.   PRAPARE - Administrator, Civil Service (Medical): No    Lack of Transportation (Non-Medical): No  Recent Concern: Transportation Needs - Unmet Transportation Needs (09/12/2021)   [facility] visits prior to 03/15/2022.   PRAPARE - Administrator, Civil Service (Medical): No    Lack of Transportation (Non-Medical): Yes  Physical Activity: Insufficiently Active (09/12/2021)   [facility] prior to 03/15/2022., [name] visits prior to 03/15/2022.   Exercise Vital Sign    Days of Exercise per Week: 2 days    Minutes of Exercise per  Session: 20 min  Stress: Stress Concern Present (09/12/2021)   [facility] prior to 03/15/2022., [facility] visits prior to 03/15/2022.   [employer] of Occupational Health - Occupational Stress Questionnaire    Feeling of Stress : To some extent  Social Connections: Moderately Isolated (09/12/2021)   [facility] visits prior to 03/15/2022., [facility] visits prior to 03/15/2022.   Social Advertising account executive [NHANES]    Frequency of Communication with Friends and Family: More than three times a week    Frequency of Social Gatherings with Friends and Family: Twice a week    Attends Religious Services: 1 to 4 times per year    Active Member of [employer] or Organizations: No    Attends Banker Meetings: Never    Marital Status: Never married  Intimate Partner Violence: Not At Risk (09/16/2021)   [facility] prior to 03/15/2022., [facility] visits prior to 03/15/2022.   Humiliation, Afraid, Rape, and Kick questionnaire    Fear of Current or Ex-Partner: No    Emotionally Abused: No    Physically Abused: No    Sexually Abused: No   No current facility-administered medications on file prior to encounter.   Current Outpatient Medications on File Prior to Encounter  Medication Sig Dispense Refill   acetaminophen (TYLENOL) 500 MG tablet Take 500 mg by mouth every 6 (six) hours as needed.     cefadroxil (DURICEF) 500 MG capsule Take 1 capsule (500 mg total) by mouth 2 (two) times daily. 14 capsule 0   cyclobenzaprine (FLEXERIL) 10 MG tablet Take 1 tablet (10 mg total) by mouth 3 (three) times daily as needed for muscle spasms. 30 tablet 0   famotidine (PEPCID) 20 MG tablet Take 1 tablet (20 mg total) by mouth daily. Pt has not started yet 90  tablet 3   fluticasone (FLONASE) 50 MCG/ACT nasal spray Place 1 spray into both nostrils as needed for allergies.     hydrOXYzine (ATARAX/VISTARIL) 25 MG tablet Take 1 tablet (25 mg total) by mouth every 6 (six) hours. 12 tablet 0   Prenat-FeAsp-Meth-FA-DHA w/o A (PRENATE PIXIE) 10-0.6-0.4-200 MG CAPS Take 1 capsule by mouth daily.     promethazine (PHENERGAN) 25 MG suppository Place 1 suppository (25 mg total) rectally every 6 (six) hours as needed for nausea. 12 each 0   promethazine (PHENERGAN) 25 MG tablet Take 1 tablet (25 mg total) by mouth every 6 (six) hours as needed for nausea or vomiting. 20 tablet 0   No Known Allergies  I have reviewed patient's Past Medical Hx, Surgical  Hx, Family Hx, Social Hx, medications and allergies.   ROS:  Review of Systems  Gastrointestinal:  Positive for abdominal pain.  Genitourinary:  Positive for pelvic pain and vaginal bleeding.   Review of Systems  Other systems negative   Physical Exam  Physical Exam Patient Vitals for the past 24 hrs:  BP Temp Temp src Pulse Resp Height Weight  09/15/22 2147 116/66 97.8 F (36.6 C) Oral 84 16 5\' 7"  (1.702 m) 114.3 kg   Constitutional: Well-developed, well-nourished female in no acute distress.  Cardiovascular: normal rate Respiratory: normal effort GI: Abd soft, non-tender.  MS: Extremities nontender, no edema, normal ROM Neurologic: Alert and oriented x 4.  GU: Neg CVAT.  PELVIC EXAM: deferred  LAB RESULTS Results for orders placed or performed during the hospital encounter of 09/15/22 (from the past 24 hour(s))  Urinalysis, Routine w reflex microscopic -Urine, Clean Catch     Status: Abnormal   Collection Time: 09/15/22  9:59 PM  Result Value Ref Range   Color, Urine YELLOW YELLOW   APPearance HAZY (A) CLEAR   Specific Gravity, Urine 1.026 1.005 - 1.030   pH 5.0 5.0 - 8.0   Glucose, UA NEGATIVE NEGATIVE mg/dL   Hgb urine dipstick LARGE (A) NEGATIVE   Bilirubin Urine NEGATIVE NEGATIVE    Ketones, ur 5 (A) NEGATIVE mg/dL   Protein, ur 30 (A) NEGATIVE mg/dL   Nitrite NEGATIVE NEGATIVE   Leukocytes,Ua NEGATIVE NEGATIVE   RBC / HPF >50 0 - 5 RBC/hpf   WBC, UA 0-5 0 - 5 WBC/hpf   Bacteria, UA NONE SEEN NONE SEEN   Squamous Epithelial / HPF 0-5 0 - 5 /HPF   Mucus PRESENT     --/--/A POS (04/11 1558)  IMAGING US OB Transvaginal  Result Date: 09/15/2022 CLINICAL DATA:  [id number]. Bleeding in early pregnancy. [id number]. Cramping affecting pregnancy, antepartum. EXAM: TRANSVAGINAL OB ULTRASOUND TECHNIQUE: Transvaginal ultrasound was performed for complete evaluation of the gestation as well as the maternal uterus, adnexal regions, and pelvic cul-de-sac. COMPARISON:  Recent study of 09/13/2022 showing a 6 weeks 6 days gestational sac and a yolk sac but no visible embryo. FINDINGS: Intrauterine gestational sac: No longer seen. Yolk sac:  No longer seen. Embryo:  Not Visualized. Cardiac Activity: N/a. MSD: None. CRL: None. Subchorionic hemorrhage:  None visualized. Maternal uterus/adnexae: Anteverted uterus measuring 9.1 x 5.1 x 6.6 cm. No wall mass is seen. The endometrial echo complex is heterogeneous and thickened measuring 16.4 mm. There is trace color flow within the complex concerning for retained products of conception. The findings are compatible with a missed abortion or abortion in progress. There is widening of the endocervical canal to 7.3 mm with open internal os and echogenic material within the canal consistent with blood clot. Both ovaries are visible and within normal size limits. Right ovary demonstrates a 1.9 cm dominant follicle. Left ovary is unremarkable.  No free fluid or adnexal mass are seen. IMPRESSION: 1. Findings consistent with a missed abortion or abortion in progress. Gestational sac visible on 09/13/2022 is no longer seen. 2. Heterogeneous, thickened endometrial complex with trace color flow, consistent with residual products of conception. 3. Open cervix with blood clot  in the endocervical canal. 4. Unremarkable ovaries. Electronically Signed   By: Almira Bar M.D.   On: 09/15/2022 23:39     MAU Management/MDM: I have reviewed the triage vital signs and the nursing notes.   Pertinent labs & imaging results that were available during my care of the  patient were reviewed by me and considered in my medical decision making (see chart for details).      I have reviewed her medical records including past results, notes and treatments. Medical, Surgical, and family history were reviewed.  Medications and recent lab tests were reviewed  Ordered Ultrasound to rule out SAB.  This showed passage of gestational sac and blood clot in cervix.    ASSESSMENT Pregnancy at [redacted]w[redacted]d by LMP Complete SAB  PLAN Discharge home Bleeding precautions Advised she call her OB in AM to update them  Pt stable at time of discharge. Encouraged to return here if she develops worsening of symptoms, increase in pain, fever, or other concerning symptoms.    Wynelle Bourgeois CNM, MSN Certified Nurse-Midwife 09/15/2022  10:27 PM

## 2022-09-15 NOTE — MAU Note (Signed)
Pt says she was at [facility] in [place] on Sat - in ER- was D/C - Then went back at 4 am- had VB- thought was SAB- did U/S - told to see OB Dr. At 4 am- was numb in lower abd - went back to hospital- gave meds . Then today - went to work- was dizzy - cold , lower abd cramps.  Now in Triage - pad- mod amt dark red . Cramps - 5/10 (Mom told pt to ask if preg is in her tubes.)

## 2022-09-16 DIAGNOSIS — Z3A10 10 weeks gestation of pregnancy: Secondary | ICD-10-CM

## 2022-09-16 DIAGNOSIS — O039 Complete or unspecified spontaneous abortion without complication: Secondary | ICD-10-CM

## 2022-10-09 ENCOUNTER — Other Ambulatory Visit: Payer: Self-pay

## 2022-10-09 ENCOUNTER — Other Ambulatory Visit (HOSPITAL_COMMUNITY): Payer: Self-pay

## 2022-10-09 ENCOUNTER — Emergency Department (HOSPITAL_COMMUNITY)
Admission: EM | Admit: 2022-10-09 | Discharge: 2022-10-09 | Disposition: A | Discharge status: Home / Self Care | Payer: Medicaid Other | Attending: Emergency Medicine | Admitting: Emergency Medicine

## 2022-10-09 ENCOUNTER — Encounter (HOSPITAL_COMMUNITY): Payer: Self-pay | Admitting: Emergency Medicine

## 2022-10-09 DIAGNOSIS — K0889 Other specified disorders of teeth and supporting structures: Secondary | ICD-10-CM | POA: Diagnosis present

## 2022-10-09 DIAGNOSIS — K029 Dental caries, unspecified: Secondary | ICD-10-CM | POA: Insufficient documentation

## 2022-10-09 MED ORDER — AMOXICILLIN 500 MG PO CAPS: 500.0000 mg | ORAL_CAPSULE | Freq: Once | ORAL | Status: DC

## 2022-10-09 MED ORDER — AMOXICILLIN-POT CLAVULANATE 875-125 MG PO TABS
1.0000 | ORAL_TABLET | Freq: Two times a day (BID) | ORAL | 0 refills | Status: AC
Start: 2022-10-09 — End: ?
  Filled 2022-10-09: qty 10, 5d supply, fill #0

## 2022-10-09 MED ORDER — LIDOCAINE VISCOUS HCL 2 % MT SOLN
15.0000 mL | Freq: Once | OROMUCOSAL | Status: AC
  Administered 2022-10-09: 15 mL via OROMUCOSAL
  Filled 2022-10-09: qty 15

## 2022-10-09 MED ORDER — AMOXICILLIN-POT CLAVULANATE 875-125 MG PO TABS
1.0000 | ORAL_TABLET | Freq: Once | ORAL | Status: AC
  Administered 2022-10-09: 1 via ORAL
  Filled 2022-10-09: qty 1

## 2022-10-09 NOTE — Discharge Instructions (Addendum)
Please take antibiotics as prescribed and schedule appointment with dentistry as you may need to either get a tooth pulled or get the cavity fixed.  Please take 600 mg of ibuprofen every 6 hours as needed for pain.  You may return to the emergency department for any worsening symptoms.

## 2022-10-09 NOTE — ED Provider Notes (Signed)
[place] EMERGENCY DEPARTMENT AT [facility] Provider Note   CSN: [id number] Arrival date & time: 10/09/22  1323     History No chief complaint on file.   Rebecca [name] is a 22 y.o. female patient who presents to the emergency department with left lower mandible facial swelling this been ongoing for couple of days.  Patient states that she does have a dental carry in the left lower gumline but is not seen her dentist in over a year.  Patient denies any trouble breathing, trouble talking, trouble speaking.  She denies fever, chills.  HPI     Home Medications Prior to Admission medications   Medication Sig Start Date End Date Taking? Authorizing Provider  amoxicillin-clavulanate (AUGMENTIN) 875-125 MG tablet Take 1 tablet by mouth every 12 (twelve) hours. 10/09/22  Yes Meredeth Ide, Andranik Jeune M, PA-C  acetaminophen (TYLENOL) 500 MG tablet Take 500 mg by mouth every 6 (six) hours as needed.    [provider]  cefadroxil (DURICEF) 500 MG capsule Take 1 capsule (500 mg total) by mouth 2 (two) times daily. 07/22/21   Levie Heritage, DO  cyclobenzaprine (FLEXERIL) 10 MG tablet Take 1 tablet (10 mg total) by mouth 3 (three) times daily as needed for muscle spasms. 06/27/21   Anyanwu, Jethro Bastos, MD  famotidine (PEPCID) 20 MG tablet Take 1 tablet (20 mg total) by mouth daily. Pt has not started yet 07/22/21   Levie Heritage, DO  fluticasone [facility]) 50 MCG/ACT nasal spray Place 1 spray into both nostrils as needed for allergies. 08/16/18   [provider]  hydrOXYzine (ATARAX/VISTARIL) 25 MG tablet Take 1 tablet (25 mg total) by mouth every 6 (six) hours. 08/19/18   Henderly, Britni A, PA-C  Prenat-FeAsp-Meth-FA-DHA w/o A (PRENATE PIXIE) 10-0.6-0.4-200 MG CAPS Take 1 capsule by mouth daily. 02/01/21   [provider]  promethazine (PHENERGAN) 25 MG suppository Place 1 suppository (25 mg total) rectally every 6 (six) hours as needed for nausea. 03/31/21   Derwood Kaplan, MD   promethazine (PHENERGAN) 25 MG tablet Take 1 tablet (25 mg total) by mouth every 6 (six) hours as needed for nausea or vomiting. 03/31/21   Derwood Kaplan, MD      Allergies    Patient has no known allergies.    Review of Systems   Review of Systems  All other systems reviewed and are negative.   Physical Exam Updated Vital Signs BP 127/82   Pulse 75   Temp 98.7 F (37.1 C) (Oral)   Resp 16   LMP 07/05/2022   SpO2 100%   Breastfeeding Unknown  Physical Exam Vitals and nursing note reviewed.  Constitutional:      Appearance: Normal appearance.  HENT:     Head: Normocephalic and atraumatic.     Comments: Left lower mandible is mildly swollen to palpation.  No obvious fluctuance    Mouth/Throat:     Comments: There are numerous dental caries over the left lower gumline.  No obvious abscess.  No swelling of the oral floor.  Posterior pharynx is normal. Eyes:     General:        Right eye: No discharge.        Left eye: No discharge.     Conjunctiva/sclera: Conjunctivae normal.  Pulmonary:     Effort: Pulmonary effort is normal.  Skin:    General: Skin is warm and dry.     Findings: No rash.  Neurological:     General: No focal  deficit present.     Mental Status: She is alert.  Psychiatric:        Mood and Affect: Mood normal.        Behavior: Behavior normal.     ED Results / Procedures / Treatments   Labs (all labs ordered are listed, but only abnormal results are displayed) Labs Reviewed - No data to display  EKG None  Radiology No results found.  Procedures Procedures    Medications Ordered in ED Medications  amoxicillin-clavulanate (AUGMENTIN) 875-125 MG per tablet 1 tablet (1 tablet Oral Given 10/09/22 1650)  lidocaine (XYLOCAINE) 2 % viscous mouth solution 15 mL (15 mLs Mouth/Throat Given 10/09/22 1650)    ED Course/ Medical Decision Making/ A&P   {   Click here for ABCD2, HEART and other calculators  Medical Decision Making Rebecca [name] is a  22 y.o. female patient who presents to the emergency department today for further evaluation of facial swelling.  I do not see any obvious dental abscess.  Posterior pharynx is normal there is no signs of respiratory distress or obstruction.  I do low suspicion for RPA or PTA or Ludwig's angina today.  Patient is nontoxic-appearing.  I will give her first dose of Augmentin here and send the rest of her prescription to the [facility] pharmacy here as well as the patient's sister is in the ICU for an asthma exacerbation.  Patient safe for discharge at this time.  All questions or concerns addressed.   Risk Prescription drug management.    Final Clinical Impression(s) / ED Diagnoses Final diagnoses:  None    Rx / DC Orders ED Discharge Orders          Ordered    amoxicillin-clavulanate (AUGMENTIN) 875-125 MG tablet  Every 12 hours        10/09/22 1655              Honor Loh [place], [place] 10/09/22 1701    Margarita Grizzle, MD 10/12/22 [id number]

## 2022-10-09 NOTE — ED Triage Notes (Signed)
Patient presents due to what she believes to be an abscess. She has noticed left side, lower jaw pain and swelling for 2 day. Pain increases when she bites down.

## 2023-03-11 ENCOUNTER — Other Ambulatory Visit: Payer: Self-pay | Admitting: Orthopedic Surgery

## 2023-03-11 ENCOUNTER — Ambulatory Visit: Payer: Self-pay

## 2023-03-11 DIAGNOSIS — M25532 Pain in left wrist: Secondary | ICD-10-CM

## 2023-11-16 ENCOUNTER — Emergency Department (HOSPITAL_BASED_OUTPATIENT_CLINIC_OR_DEPARTMENT_OTHER): Payer: MEDICAID

## 2023-11-16 ENCOUNTER — Encounter (HOSPITAL_BASED_OUTPATIENT_CLINIC_OR_DEPARTMENT_OTHER): Payer: Self-pay | Admitting: Emergency Medicine

## 2023-11-16 ENCOUNTER — Other Ambulatory Visit: Payer: Self-pay

## 2023-11-16 ENCOUNTER — Emergency Department (HOSPITAL_BASED_OUTPATIENT_CLINIC_OR_DEPARTMENT_OTHER)
Admission: EM | Admit: 2023-11-16 | Discharge: 2023-11-16 | Disposition: A | Discharge status: Home / Self Care | Payer: MEDICAID | Attending: Emergency Medicine | Admitting: Emergency Medicine

## 2023-11-16 DIAGNOSIS — J069 Acute upper respiratory infection, unspecified: Secondary | ICD-10-CM | POA: Diagnosis not present

## 2023-11-16 DIAGNOSIS — R059 Cough, unspecified: Secondary | ICD-10-CM | POA: Diagnosis present

## 2023-11-16 LAB — RESP PANEL BY RT-PCR (RSV, FLU A&B, COVID)  RVPGX2
Influenza A by PCR: NEGATIVE
Influenza B by PCR: NEGATIVE
Resp Syncytial Virus by PCR: NEGATIVE
SARS Coronavirus 2 by RT PCR: NEGATIVE

## 2023-11-16 LAB — PREGNANCY, URINE: Preg Test, Ur: NEGATIVE

## 2023-11-16 LAB — GROUP A STREP BY PCR: Group A Strep by PCR: NOT DETECTED

## 2023-11-16 MED ORDER — ALBUTEROL SULFATE HFA 108 (90 BASE) MCG/ACT IN AERS: 1.0000 | INHALATION_SPRAY | Freq: Four times a day (QID) | RESPIRATORY_TRACT | 0 refills | Status: AC | PRN

## 2023-11-16 MED ORDER — ALBUTEROL SULFATE (2.5 MG/3ML) 0.083% IN NEBU
2.5000 mg | INHALATION_SOLUTION | Freq: Once | RESPIRATORY_TRACT | Status: AC
  Administered 2023-11-16: 2.5 mg via RESPIRATORY_TRACT
  Filled 2023-11-16: qty 3

## 2023-11-16 MED ORDER — BENZONATATE 100 MG PO CAPS: 100.0000 mg | ORAL_CAPSULE | Freq: Three times a day (TID) | ORAL | 0 refills | Status: AC

## 2023-11-16 NOTE — ED Triage Notes (Signed)
 Pt c/o productive cough, sore throat, headache x 4-5 days.   Children recently ill in home.

## 2023-11-16 NOTE — ED Provider Notes (Signed)
 [place] [facility] Provider Note   CSN: [id number] Arrival date & time: 11/16/23  8151     Patient presents with: URI   Rebecca [name] is a 23 y.o. female.  Patient presents to the emergency department today with concerns of URI.  Endorsing productive cough, sore throat, headache.  States multiple children at home sick with similar symptoms.  No reported fever.  Past history significant for migraines, deficiency anemia, depression.  She endorses ongoing intermittent productive cough but denies any feelings of chest pain or shortness of breath.  States multiple family numbers tested positive for rhinovirus recently.  She is concerned this is likely what is causing her symptoms.   URI Presenting symptoms: cough        Prior to Admission medications   Medication Sig Start Date End Date Taking? Authorizing Provider  albuterol  (VENTOLIN  HFA) 108 (90 Base) MCG/ACT inhaler Inhale 1-2 puffs into the lungs every 6 (six) hours as needed for wheezing or shortness of breath. 11/16/23  Yes Mylinda Brook A, PA-C  benzonatate (TESSALON) 100 MG capsule Take 1 capsule (100 mg total) by mouth every 8 (eight) hours. 11/16/23  Yes Sam Overbeck A, PA-C  acetaminophen  (TYLENOL ) 500 MG tablet Take 500 mg by mouth every 6 (six) hours as needed.    [provider]  amoxicillin -clavulanate (AUGMENTIN ) 875-125 MG tablet Take 1 tablet by mouth every 12 (twelve) hours. 10/09/22   Theotis Cameron HERO, PA-C  cefadroxil  (DURICEF) 500 MG capsule Take 1 capsule (500 mg total) by mouth 2 (two) times daily. 07/22/21   Stinson, Jacob J, DO  cyclobenzaprine  (FLEXERIL ) 10 MG tablet Take 1 tablet (10 mg total) by mouth 3 (three) times daily as needed for muscle spasms. 06/27/21   Anyanwu, Ugonna A, MD  famotidine  (PEPCID ) 20 MG tablet Take 1 tablet (20 mg total) by mouth daily. Pt has not started yet 07/22/21   Stinson, Jacob J, DO  fluticasone  (FLONASE ) 50 MCG/ACT nasal spray Place 1 spray  into both nostrils as needed for allergies. 08/16/18   [provider]  hydrOXYzine  (ATARAX /VISTARIL ) 25 MG tablet Take 1 tablet (25 mg total) by mouth every 6 (six) hours. 08/19/18   Henderly, Britni A, PA-C  Prenat-FeAsp-Meth-FA-DHA w/o A (PRENATE PIXIE) 10-0.6-0.4-200 MG CAPS Take 1 capsule by mouth daily. 02/01/21   [provider]  promethazine  (PHENERGAN ) 25 MG suppository Place 1 suppository (25 mg total) rectally every 6 (six) hours as needed for nausea. 03/31/21   Charlyn Sora, MD  promethazine  (PHENERGAN ) 25 MG tablet Take 1 tablet (25 mg total) by mouth every 6 (six) hours as needed for nausea or vomiting. 03/31/21   Charlyn Sora, MD    Allergies: Patient has no known allergies.    Review of Systems  Respiratory:  Positive for cough.   All other systems reviewed and are negative.   Updated Vital Signs BP 120/75   Pulse (!) 113   Temp 98.7 F (37.1 C) (Oral)   Resp 17   Ht 5' 7 (1.702 m)   SpO2 99%   Breastfeeding No   BMI 39.45 kg/m   Physical Exam Vitals and nursing note reviewed.  Constitutional:      General: She is not in acute distress.    Appearance: She is well-developed.  HENT:     Head: Normocephalic and atraumatic.  Eyes:     Conjunctiva/sclera: Conjunctivae normal.  Cardiovascular:     Rate and Rhythm: Normal rate and regular rhythm.  Heart sounds: No murmur heard. Pulmonary:     Effort: Pulmonary effort is normal. No respiratory distress.     Breath sounds: Normal breath sounds. No wheezing, rhonchi or rales.  Abdominal:     General: Abdomen is flat. Bowel sounds are normal. There is no distension.     Palpations: Abdomen is soft.     Tenderness: There is no abdominal tenderness. There is no guarding.  Musculoskeletal:        General: No swelling.     Cervical back: Neck supple.  Skin:    General: Skin is warm and dry.     Capillary Refill: Capillary refill takes less than 2 seconds.  Neurological:     Mental Status: She  is alert.  Psychiatric:        Mood and Affect: Mood normal.     (all labs ordered are listed, but only abnormal results are displayed) Labs Reviewed  GROUP A STREP BY PCR  RESP PANEL BY RT-PCR (RSV, FLU A&B, COVID)  [id number]  PREGNANCY, URINE    EKG: None  Radiology: DG Chest 2 View Result Date: 11/16/2023 CLINICAL DATA:  Headache and productive cough. EXAM: CHEST - 2 VIEW COMPARISON:  July 28, 2023 FINDINGS: The heart size and mediastinal contours are within normal limits. Both lungs are clear. The visualized skeletal structures are unremarkable. IMPRESSION: No active cardiopulmonary disease. Electronically Signed   By: Suzen Dials M.D.   On: 11/16/2023 21:18     Procedures   Medications Ordered in the ED  albuterol  (PROVENTIL ) (2.5 MG/3ML) 0.083% nebulizer solution 2.5 mg (2.5 mg Nebulization Given 11/16/23 2051)                                    Medical Decision Making Amount and/or Complexity of Data Reviewed Labs: ordered. Radiology: ordered.  Risk Prescription drug management.   This patient presents to the ED for concern of URI.  Differential diagnosis includes COVID-19, influenza, bronchitis, pneumonia    Additional history obtained:  Additional history obtained from epic chart review   Lab Tests:  I Ordered, and personally interpreted labs.  The pertinent results include: Respiratory panel negative for COVID-19, influenza A/B, RSV, group A strep negative, urine pregnancy negative   Imaging Studies ordered:  I ordered imaging studies including chest x-ray I independently visualized and interpreted imaging which showed negative for acute findings I agree with the radiologist interpretation   Medicines ordered and prescription drug management:  I ordered medication including albuterol  for wheezing Reevaluation of the patient after these medicines showed that the patient improved I have reviewed the patients home medicines and have made  adjustments as needed   Problem List / ED Course:  Patient presents to the emergency department today with concerns of URI symptoms.  Reports ongoing cough, sore throat, and headache for the last 4 to 5 days.  States multiple children sick at home with diagnosed rhinovirus.  Denies any feelings of shortness of breath or chest pain.  Has been using over-the-counter medications with minimal improvement in symptoms. Physical exam reveals wheezing in multiple lung fields but no abnormal heart sounds.  She is alert and oriented and acting and behaving appropriately. Respiratory panel negative.  Group A strep negative.  Urine pregnancy negative.  Chest x-ray negative for any acute findings.  Albuterol  administered for management of patient's wheezing. On reexamination, wheezing has resolved.  Suspect likely reactive airways due to  viral etiology.  Given patient's reported contact with family members who are positive for rhinovirus, suspect this is likely source of patient's current symptoms.  Advise continue management of symptoms with Tylenol  ibuprofen  as needed.  Albuterol  inhaler sent to pharmacy for wheezing or shortness of breath as needed.  Return precautions discussed.  She is otherwise stable for outpatient follow-up and discharged home.   Social Determinants of Health:  None  Final diagnoses:  Viral URI with cough    ED Discharge Orders          Ordered    albuterol  (VENTOLIN  HFA) 108 (90 Base) MCG/ACT inhaler  Every 6 hours PRN        11/16/23 2209    benzonatate (TESSALON) 100 MG capsule  Every 8 hours        11/16/23 2209               Zinia Innocent A, PA-C 11/16/23 2306    Emil Share, DO 11/16/23 2313

## 2023-11-16 NOTE — Discharge Instructions (Addendum)
 You were seen in the ER today for concerns of cough, congestion, bodyaches, and wheezing. You were negative for COVID, influenza, RSV, strep, and your chest xray was normal. I would advise continuing to manage your symptoms for what I suspect is viral respiratory infection with Tylenol  and ibuprofen  for pain or fevers. I have sent prescriptions for an inhaler and cough medication to your pharmacy. Use these as need and instructed. Do not share these medications with your children.

## 2024-01-26 ENCOUNTER — Emergency Department (HOSPITAL_BASED_OUTPATIENT_CLINIC_OR_DEPARTMENT_OTHER): Payer: MEDICAID

## 2024-01-26 ENCOUNTER — Telehealth: Payer: MEDICAID | Admitting: Family Medicine

## 2024-01-26 ENCOUNTER — Emergency Department (HOSPITAL_BASED_OUTPATIENT_CLINIC_OR_DEPARTMENT_OTHER)
Admission: EM | Admit: 2024-01-26 | Discharge: 2024-01-26 | Disposition: A | Discharge status: Home / Self Care | Payer: MEDICAID | Attending: Emergency Medicine | Admitting: Emergency Medicine

## 2024-01-26 ENCOUNTER — Encounter (HOSPITAL_BASED_OUTPATIENT_CLINIC_OR_DEPARTMENT_OTHER): Payer: Self-pay

## 2024-01-26 DIAGNOSIS — W010XXA Fall on same level from slipping, tripping and stumbling without subsequent striking against object, initial encounter: Secondary | ICD-10-CM | POA: Insufficient documentation

## 2024-01-26 DIAGNOSIS — S8991XA Unspecified injury of right lower leg, initial encounter: Secondary | ICD-10-CM | POA: Diagnosis present

## 2024-01-26 DIAGNOSIS — W19XXXA Unspecified fall, initial encounter: Secondary | ICD-10-CM

## 2024-01-26 DIAGNOSIS — M25569 Pain in unspecified knee: Secondary | ICD-10-CM

## 2024-01-26 DIAGNOSIS — S86911A Strain of unspecified muscle(s) and tendon(s) at lower leg level, right leg, initial encounter: Secondary | ICD-10-CM

## 2024-01-26 DIAGNOSIS — S8391XA Sprain of unspecified site of right knee, initial encounter: Secondary | ICD-10-CM | POA: Insufficient documentation

## 2024-01-26 LAB — PREGNANCY, URINE: Preg Test, Ur: NEGATIVE

## 2024-01-26 MED ORDER — OXYCODONE-ACETAMINOPHEN 5-325 MG PO TABS
1.0000 | ORAL_TABLET | Freq: Once | ORAL | Status: AC
  Administered 2024-01-26: 1 via ORAL
  Filled 2024-01-26: qty 1

## 2024-01-26 MED ORDER — IBUPROFEN 600 MG PO TABS: 600.0000 mg | ORAL_TABLET | Freq: Four times a day (QID) | ORAL | 0 refills | Status: AC | PRN

## 2024-01-26 NOTE — Patient Instructions (Signed)
 Acute Knee Pain, Adult Many things can cause knee pain. Sometimes, knee pain is sudden (acute). It may be caused by damage, swelling, or irritation of the muscles and tissues that support your knee. Pain may come from: A fall. An injury to the knee from twisting motions. A hit to the knee. Infection. The pain often goes away on its own with time and rest. If the pain does not go away, tests may be done to find out what is causing the pain. These may include: Imaging tests, such as an X-ray, MRI, CT scan, or ultrasound. Joint aspiration. In this test, fluid is removed from the knee and checked. Arthroscopy. In this test, a lighted tube is put in the knee and an image is shown on a screen. A biopsy. In this test, a health care provider will remove a small piece of tissue for testing. Follow these instructions at home: If you have a knee sleeve or brace that can be taken off:  Wear the knee sleeve or brace as told by your provider. Take it off only if your provider says that you can. Check the skin around it every day. Tell your provider if you see problems. Loosen the knee sleeve or brace if your toes tingle, are numb, or turn cold and blue. Keep the knee sleeve or brace clean and dry. Bathing If the knee sleeve or brace is not waterproof: Do not let it get wet. Cover it when you take a bath or shower. Use a cover that does not let any water in. Managing pain, stiffness, and swelling  If told, put ice on the area. If you have a knee sleeve or brace that you can take off, remove it as told. Put ice in a plastic bag. Place a towel between your skin and the bag. Leave the ice on for 20 minutes, 2-3 times a day. If your skin turns bright red, take off the ice right away to prevent skin damage. The risk of damage is higher if you cannot feel pain, heat, or cold. Move your toes often to reduce stiffness and swelling. Raise the injured area above the level of your heart while you are sitting  or lying down. Use a pillow to support your foot as needed. If told, use an elastic bandage to put pressure (compression) on your injured knee. This may control swelling, give support, and help with discomfort. Sleep with a pillow under your knee. Activity Rest your knee. Do not do things that cause pain or make pain worse. Do not stand or walk on your injured knee until you're told it's okay. Use crutches as told. Avoid activities where both feet leave the ground at the same time and put stress on the joints. Avoid running, jumping rope, and doing jumping jacks. Work with a physical therapist to make a safe exercise program if told. Physical therapy helps your knee move better and get stronger. Exercise as told. General instructions Take your medicines only as told by your provider. If you are overweight, work with your provider and an expert in healthy eating, called a dietician, to set goals to lose weight. Being overweight can make your knee hurt more. Do not smoke, vape, or use products with nicotine or tobacco in them. If you need help quitting, talk with your provider. Return to normal activities when you are told. Ask what things are safe for you to do. Watch for any changes in your symptoms. Keep all follow-up visits. Your provider will check  your healing and adjust treatments if needed. Contact a health care provider if: The knee pain does not stop. The knee pain changes or gets worse. You have a fever along with knee pain. Your knee is red or feels warm when you touch it. Your knee gives out or locks up. Get help right away if: Your knee swells and the swelling gets worse. You cannot move your knee. You have very bad knee pain that does not get better with medicine. This information is not intended to replace advice given to you by your health care provider. Make sure you discuss any questions you have with your health care provider. Document Revised: 10/02/2022 Document  Reviewed: 02/24/2022 Elsevier Patient Education  2024 [employer].

## 2024-01-26 NOTE — ED Notes (Signed)
 Discharge paperwork reviewed entirely with patient, including follow up care. Pain was under control. The patient received instruction and coaching on their prescriptions, and all follow-up questions were answered.  Pt verbalized understanding as well as all parties involved. No questions or concerns voiced at the time of discharge. No acute distress noted. Pt was encouraged to stay adequately hydrated and eat a healthy diet.   Pt was wheeled out to the PVA in a wheelchair without incident.  Pt advised they will notify their PCP immediately. and Pt advised they will seek followup care with a specialist and followup with their PCP.   The pt was instructed to set up and/or review MyChart for their results; and was informed their Providers all have access to the information as well.

## 2024-01-26 NOTE — ED Triage Notes (Signed)
 BIB [facility], fell three weeks ago and went to the ER and told she tore her meniscus.  She has been wearing leg brace since then.  Tonight she was going to the rest room and had taken off the brace for the night and fell going to the restroom. She is reporting pain in right knee.   Patient unable to lower leg off of chair.  VSS stable.

## 2024-01-26 NOTE — ED Provider Notes (Signed)
 " [place] [facility] Provider Note   CSN: [id number] Arrival date & time: 01/26/24  9944     Patient presents with: Knee Pain   Rebecca [name] is a 24 y.o. female.   The history is provided by the patient.  Knee Pain Rebecca [name] is a 24 y.o. female who presents to the Emergency Department complaining of knee pain.  She presents to the emergency department for evaluation of right knee pain.  She had a fall 3 weeks ago and was seen in the emergency department at that time and was told that she had a meniscus tear.  Since that time she has fallen twice more.  Tonight she was not using her brace because it was in another room and she was getting up off the toilet and she slipped and fell, twisting her knee.  She did hear a crack and a pop.  It did not look or feel as if it went out of joint.  Her pain radiates to her right hip.  Pain is described as sharp in nature.  She is 5 months postpartum.  She is not currently breast-feeding.     Prior to Admission medications  Medication Sig Start Date End Date Taking? Authorizing Provider  ibuprofen  (ADVIL ) 600 MG tablet Take 1 tablet (600 mg total) by mouth every 6 (six) hours as needed. 01/26/24  Yes Griselda Norris, MD  acetaminophen  (TYLENOL ) 500 MG tablet Take 500 mg by mouth every 6 (six) hours as needed.    [provider]  albuterol  (VENTOLIN  HFA) 108 (90 Base) MCG/ACT inhaler Inhale 1-2 puffs into the lungs every 6 (six) hours as needed for wheezing or shortness of breath. 11/16/23   Zelaya, Oscar A, PA-C  amoxicillin -clavulanate (AUGMENTIN ) 875-125 MG tablet Take 1 tablet by mouth every 12 (twelve) hours. 10/09/22   Theotis Peers M, PA-C  benzonatate  (TESSALON ) 100 MG capsule Take 1 capsule (100 mg total) by mouth every 8 (eight) hours. 11/16/23   Zelaya, Oscar A, PA-C  cefadroxil  (DURICEF) 500 MG capsule Take 1 capsule (500 mg total) by mouth 2 (two) times daily. 07/22/21   Stinson, Jacob J, DO   cyclobenzaprine  (FLEXERIL ) 10 MG tablet Take 1 tablet (10 mg total) by mouth 3 (three) times daily as needed for muscle spasms. 06/27/21   Anyanwu, Ugonna A, MD  famotidine  (PEPCID ) 20 MG tablet Take 1 tablet (20 mg total) by mouth daily. Pt has not started yet 07/22/21   Stinson, Jacob J, DO  fluticasone  (FLONASE ) 50 MCG/ACT nasal spray Place 1 spray into both nostrils as needed for allergies. 08/16/18   [provider]  hydrOXYzine  (ATARAX /VISTARIL ) 25 MG tablet Take 1 tablet (25 mg total) by mouth every 6 (six) hours. 08/19/18   Henderly, Britni A, PA-C  Prenat-FeAsp-Meth-FA-DHA w/o A (PRENATE PIXIE) 10-0.6-0.4-200 MG CAPS Take 1 capsule by mouth daily. 02/01/21   [provider]  promethazine  (PHENERGAN ) 25 MG suppository Place 1 suppository (25 mg total) rectally every 6 (six) hours as needed for nausea. 03/31/21   Charlyn Sora, MD  promethazine  (PHENERGAN ) 25 MG tablet Take 1 tablet (25 mg total) by mouth every 6 (six) hours as needed for nausea or vomiting. 03/31/21   Charlyn Sora, MD    Allergies: Patient has no known allergies.    Review of Systems  All other systems reviewed and are negative.   Updated Vital Signs BP (!) 121/93 (BP Location: Left Arm)   Pulse 88   Temp 99.1 F (  37.3 C) (Oral)   Resp 16   Ht 5' 7 (1.702 m)   Wt 114.8 kg   SpO2 100%   BMI 39.63 kg/m   Physical Exam Vitals and nursing note reviewed.  Constitutional:      Appearance: She is well-developed.  HENT:     Head: Normocephalic and atraumatic.  Cardiovascular:     Rate and Rhythm: Normal rate and regular rhythm.  Pulmonary:     Effort: Pulmonary effort is normal. No respiratory distress.  Abdominal:     Palpations: Abdomen is soft.     Tenderness: There is no abdominal tenderness. There is no guarding or rebound.  Musculoskeletal:     Comments: There is diffuse tenderness to palpation throughout the right knee without any discrete bony step-off.  She is able to flex and extend  the knee but does have pain on range of motion.  2+ DP pulses.  No tenderness over the right hip.  Skin:    General: Skin is warm and dry.  Neurological:     Mental Status: She is alert and oriented to person, place, and time.  Psychiatric:        Behavior: Behavior normal.     (all labs ordered are listed, but only abnormal results are displayed) Labs Reviewed  PREGNANCY, URINE    EKG: None  Radiology: DG Hip Unilat W or Wo Pelvis 2-3 Views Right Result Date: 01/26/2024 EXAM: 2 or 3 VIEW(S) XRAY OF THE RIGHT HIP 01/26/2024 03:56:56 AM COMPARISON: None available. CLINICAL HISTORY: Fall FINDINGS: BONES AND JOINTS: No acute fracture. No malalignment. SOFT TISSUES: Unremarkable. IMPRESSION: 1. No significant abnormality in the right hip or visualized pelvis. Electronically signed by: Evalene Coho MD MD 01/26/2024 04:02 AM EST RP Workstation: [id number]   DG Knee Complete 4 Views Right Result Date: 01/26/2024 EXAM: 4 OR MORE VIEW(S) XRAY OF THE RIGHT KNEE 01/26/2024 01:40:00 AM COMPARISON: None available. CLINICAL HISTORY: A fall 3 weeks ago with persistent pain, initial encounter. FINDINGS: BONES AND JOINTS: No acute fracture. No significant joint effusion. No significant degenerative changes. SOFT TISSUES: Unremarkable. IMPRESSION: 1. No acute fracture or dislocation. Electronically signed by: Oneil Devonshire MD MD 01/26/2024 01:45 AM EST RP Workstation: [id number]     Procedures   Medications Ordered in the ED  oxyCODONE -acetaminophen  (PERCOCET/ROXICET) 5-325 MG per tablet 1 tablet (1 tablet Oral Given 01/26/24 0244)                                    Medical Decision Making Amount and/or Complexity of Data Reviewed Labs: ordered. Radiology: ordered.  Risk Prescription drug management.   Patient here for evaluation of recurrent right knee pain and injury.  She does have soft tissue tenderness on examination, no evidence of acute fracture or dislocation on imaging.  She has  good distal perfusion.  Discussed with patient that she will need to follow-up with the orthopedic surgeon due to concern for possible ligamentous injury.  Discussed maintaining her knee immobilizer.  Discussed outpatient follow-up and return precautions.  Discussed medications for pain control.     Final diagnoses:  Strain of right knee, initial encounter    ED Discharge Orders          Ordered    ibuprofen  (ADVIL ) 600 MG tablet  Every 6 hours PRN        01/26/24 0409  Griselda Norris, MD 01/26/24 0424  "
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
